# Patient Record
Sex: Male | Born: 1960 | Race: White | Hispanic: No | Marital: Married | Smoking: Never smoker
Health system: Southern US, Community
[De-identification: ages and names within clinical notes are randomized; demographics above are authoritative.]

## PROBLEM LIST (undated history)

## (undated) DIAGNOSIS — K2990 Gastroduodenitis, unspecified, without bleeding: Secondary | ICD-10-CM

## (undated) DIAGNOSIS — Z87442 Personal history of urinary calculi: Secondary | ICD-10-CM

## (undated) HISTORY — DX: Gastroduodenitis, unspecified, without bleeding: K29.90

## (undated) HISTORY — DX: Personal history of urinary calculi: Z87.442

---

## 2001-07-02 ENCOUNTER — Ambulatory Visit (HOSPITAL_COMMUNITY): Admission: RE | Admit: 2001-07-02 | Discharge: 2001-07-02 | Payer: Self-pay | Admitting: Pulmonary Disease

## 2003-07-02 ENCOUNTER — Ambulatory Visit (HOSPITAL_COMMUNITY): Admission: RE | Admit: 2003-07-02 | Discharge: 2003-07-02 | Payer: Self-pay | Admitting: Internal Medicine

## 2003-07-22 ENCOUNTER — Ambulatory Visit (HOSPITAL_COMMUNITY): Admission: RE | Admit: 2003-07-22 | Discharge: 2003-07-22 | Payer: Self-pay | Admitting: Internal Medicine

## 2006-03-07 DIAGNOSIS — Z87442 Personal history of urinary calculi: Secondary | ICD-10-CM

## 2006-03-07 HISTORY — DX: Personal history of urinary calculi: Z87.442

## 2006-03-07 HISTORY — PX: KIDNEY STONE SURGERY: SHX686

## 2006-10-31 ENCOUNTER — Ambulatory Visit (HOSPITAL_COMMUNITY): Admission: RE | Admit: 2006-10-31 | Discharge: 2006-10-31 | Payer: Self-pay | Admitting: Urology

## 2006-10-31 ENCOUNTER — Other Ambulatory Visit: Payer: Self-pay | Admitting: Urology

## 2009-02-09 ENCOUNTER — Ambulatory Visit (HOSPITAL_COMMUNITY): Admission: RE | Admit: 2009-02-09 | Discharge: 2009-02-09 | Payer: Self-pay | Admitting: Pulmonary Disease

## 2010-03-28 ENCOUNTER — Encounter: Payer: Self-pay | Admitting: Unknown Physician Specialty

## 2010-03-28 ENCOUNTER — Encounter (INDEPENDENT_AMBULATORY_CARE_PROVIDER_SITE_OTHER): Payer: Self-pay | Admitting: Internal Medicine

## 2010-07-20 NOTE — Op Note (Signed)
NAMEMAURICO, PERRELL               ACCOUNT NO.:  [id number]   MEDICAL RECORD NO.:  [id number]          PATIENT TYPE:  AMB   LOCATION:  DAY                          FACILITY:  [facility]   PHYSICIAN:  Boston Service, M.D.DATE OF BIRTH:  01/31/1961   DATE OF PROCEDURE:  10/31/2006  DATE OF DISCHARGE:                               OPERATIVE REPORT   PREOPERATIVE DIAGNOSIS:  6 mm left ureterovesical junction stone that  has probably been present for about five to six weeks.   POSTOPERATIVE DIAGNOSIS:  6 mm left ureterovesical junction stone that  has probably been present for about five to six weeks.   PROCEDURE:  Cystoscopy, retrograde ureteroscopy, holmium laser  fragmentation of stone the 365 fiber.   SURGEON:  Boston Service, M.D.   ASSISTANT:  None.   ANESTHESIA:  General.   FINDINGS:  6 mm left ureterovesical junction stone.   SPECIMENS:  Multiple small fragments.   ESTIMATED BLOOD LOSS:  Minimal.   COMPLICATIONS:  None obvious.   DESCRIPTION OF PROCEDURE:  The patient was prepped and draped in the  dorsal lithotomy position after institution of adequate level of general  anesthesia.  A well lubricated 21-French panendoscope was gently  inserted at the urethral meatus, normal urethra and sphincter,  nonobstructive prostate.  Clear reflux right orifice, minimal reflux  left orifice.   Blocking catheter was selected, positioned at the right and left  ureteral orifice.  Normal course and caliber of the ureter, pelvis and  calyces on the right side, 6 mm filling defects seen on the left side.  Guidewire was negotiated beyond the filling defect.  Short 6-French  ureteroscope was inserted alongside the guidewire.  Stone was localized  and fragmentation with the holmium laser fiber 365 was commenced at a  joule setting of 0.5.  Over period about 20 to 25 minutes, the stone was  adequately fragmented.  All fragments appeared to be 1 mm or less and  many of the  fragments  had passed into the bladder.  Ureteroscope was advanced.  There were no stony fragments identified within the proximal ureter.  Ureteroscope was withdrawn.  No double-J stent was left indwelling.  Bladder was drained.  Cystoscope was removed.  The patient was returned  to recovery in satisfactory condition.           ______________________________  Boston Service, M.D.     RH/MEDQ  D:  11/16/2006  T:  11/17/2006  Job:  [id number]   cc:   Ramon Dredge L. Juanetta Gosling, M.D.  Fax: [phone]

## 2010-07-23 NOTE — Consult Note (Signed)
Phillip, Benson                           ACCOUNT NO.:  [id number]   MEDICAL RECORD NO.:  [id number]                  PATIENT TYPE:   LOCATION:                                       FACILITY:  [facility]   PHYSICIAN:  Lionel December, M.D.                 DATE OF BIRTH:  11/28/1950   DATE OF CONSULTATION:  06/25/2003  DATE OF DISCHARGE:                                   CONSULTATION   GASTROENTEROLOGY CONSULTATION:   REQUESTING PHYSICIAN:  Dr. Juanetta Gosling   REASON FOR CONSULTATION:  Acute GERD symptoms.   HISTORY OF PRESENT ILLNESS:  Phillip Benson is a 50 year old Caucasian male who  presents to our office with a 61-month history of severe postprandial  epigastric pain and pressure which he describes as spasms.  He also notes  that it is associated with shortness of breath and relieved with belching.  He has been on Aciphex 20 mg daily for about 5 weeks now, this has resolved  his symptoms about 50%.  He also is complaining of some retrosternal chest  pressure however, he denies any acute pain.  He states his symptoms are  worse with certain foods including orange juice and chocolate.  He does  report occasional light-headedness with these episodes as well however, he  denies any diaphoresis.  He denies any nausea, vomiting, dysphagia, or  odynophagia.  He denies any lower abdominal pain.  He reports his bowel  movements have occasionally been hard since he has started the Aciphex  however, he denies any constipation, pretty much having bowel movements on a  daily basis.  He does have history of hemorrhoids with some small volume  bright red bleeding noted on toilet paper.  He does use over-the-counter  modalities and this has resolved over the last 2 weeks.  He states he has  been doing a lot of remodeling around his home, frequent heavy lifting as  well as crouching, and he notes increase in stress at home as well.  He  reports occasionally Goody's Powder two to three times a month;  denies any  other aspirin or NSAID use.  He is currently using Tums along with Aciphex  three or four times a day however, not every day.   PAST MEDICAL HISTORY:  1. Hemorrhoids.  2. Genital herpes diagnosed approximately 4 years ago being followed by Dr.     Juanetta Gosling.   PAST SURGICAL HISTORY:  Denies any.   CURRENT MEDICATIONS:  1. Aciphex 20 mg daily.  2. Tylenol p.r.n.   ALLERGIES:  No known drug allergies.   FAMILY HISTORY:  No known family history of colorectal carcinoma or lower  chronic GI problems.  Mother and father both in their 16s.  Mother does have  history of renal failure, hypertension, and hyperlipidemia.  He has a  maternal grandfather with history of diabetes mellitus and renal failure.  One healthy sister.  SOCIAL HISTORY:  Mr. Delaguila is married for 20 years.  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  He  denies any tobacco, alcohol, or drug use.   REVIEW OF SYSTEMS:  CONSTITUTIONAL:  Weight is steady.  Appetite fluctuates.  He has been eating more small meals.  He is complaining of some fatigue  although he attributes this to the increased amount of stress at home and  work.  NEUROLOGIC:  He is complaining of occasional severe frontal and  sinus headaches, he takes an occasional Goody's Powder for this.  GI:  See  HPI.   PHYSICAL EXAMINATION:  VITAL SIGNS:  Weight 179.5 pounds, height 71-1/2  inches.  Temperature 97, blood pressure 100/60, pulse 78.  GENERAL:  Phillip Benson is well developed, well nourished in no apparent  distress.  He appears his stated age.  HEENT:  Sclerae are clear, nonicteric.  Conjunctivae pink.  Oropharynx pink  and moist without any lesions.  NECK:  Supple without mass or thyromegaly.  CHEST:  Heart regular rate and rhythm with normal S1, S2, without murmurs,  clicks, rubs, or gallops.  LUNGS:  Clear to auscultation bilaterally.  ABDOMEN:  Slightly positive bowel sounds x4,  soft, nontender, nondistended.  No palpable mass or hepatosplenomegaly.  Negative Murphy's sign.  No rebound  tenderness or guarding.  EXTREMITIES:  Pulses bilaterally.  No edema.  SKIN:  Pink, warm and dry without any rash or jaundice.   ASSESSMENT:  Phillip Benson is a 50 year old Caucasian male with acute episode  of epigastric pain which he describes as spasms, symptoms have been relieved  50% with daily proton pump inhibitor therapy, there is a good possibility  his symptoms may be related to gastroesophageal reflux disease.  Because his  symptoms are not completely resolved with proton pump inhibitor, would  recommend further evaluation with EGD by Dr. Karilyn Cota to evaluate potential  complications of gastroesophageal reflux disease including Barrett's  esophagus.   RECOMMENDATIONS:  1. Have provided GERD literature.  2. Remain on the Aciphex 20 mg daily.  3. Recommend stool softener p.r.n.  4. I have given him prescription for Aciphex 20 mg to be taken 30 minutes     before breakfast in the morning (#30) with three refills, also I have     given 2-week samples.  5. We will scheduled EGD with Dr. Karilyn Cota.  I have discussed this procedure     including risks and benefits which     include but are not limited to bleeding, infection, perforation, and drug     reaction.  He agrees with this plan.  Consent will be obtained.   We would like to thank Dr. Juanetta Gosling for allowing [place] to participate in the  care of [name].     ________________________________________  ___________________________________________  Nicholas Lose, N.P.                  Lionel December, M.D.   KC/MEDQ  D:  06/25/2003  T:  06/26/2003  Job:  [id number]   cc:   Ramon Dredge L. Juanetta Gosling, M.D.  [address]  [place]  [place] [address]  Fax: [phone]

## 2010-07-23 NOTE — Op Note (Signed)
Phillip Benson, Phillip Benson                         ACCOUNT NO.:  [id number]   MEDICAL RECORD NO.:  [id number]                   PATIENT TYPE:  AMB   LOCATION:  DAY                                  FACILITY:  [facility]   PHYSICIAN:  Lionel December, M.D.                 DATE OF BIRTH:  05-Jul-1960   DATE OF PROCEDURE:  07/02/2003  DATE OF DISCHARGE:                                 OPERATIVE REPORT   PROCEDURE:  Esophagogastroduodenoscopy.   INDICATIONS FOR PROCEDURE:  Symeon is a 50 year old Caucasian male with a two  to three-month history of severe transient epigastric and chest pain which  almost always occurs postprandially and is described as a spasm.  He has  been treated with a PPI and he feels maybe 50% better.  He has also changed  his eating habits completely.  He denies dysphagia, hoarseness, chronic  cough, or melena.  He is undergoing diagnostic EGD.   The procedure risks were reviewed with the patient, and informed consent for  the procedure was obtained.   PREOPERATIVE MEDICATIONS:  Cetacaine spray for pharyngeal topical  anesthesia, Demerol 50 mg IV, Versed 5 mg IV in divided dose.   FINDINGS:  The procedure was performed in the endoscopy suite.  The  patient's vital signs and O2 saturations were monitored during the procedure  and remained stable.  The patient was placed in the left lateral recumbent  position, and the Olympus videoscope was passed via the oropharynx without  any difficulty into the esophagus.   Esophagus:  The mucosa of the esophagus was normal throughout.  The  squamocolumnar junction was carefully examined and was unremarkable.   Stomach:  It was empty and distended very well with insufflation.  The folds  of the proximal stomach were normal.  Examination of  the mucosa at the  gastric body, antrum, pyloric channel, as well as angularis, fundus, and  cardia was normal.   Duodenum:  Examination of the bulb and postbulbar duodenum was also normal.  The  endoscope was withdrawn.  The patient tolerated the procedure well.   FINAL DIAGNOSIS:  Normal esophagogastroduodenoscopy.   RECOMMENDATIONS:  1. He will continue antireflux measures and Aciphex as before.  2. Will try him on Levbid one-half to one tablet before breakfast.  If he     does not respond to this approach, will consider upper abdominal     ultrasound followed by esophageal manometry and pH.      ___________________________________________                                            Lionel December, M.D.   NR/MEDQ  D:  07/02/2003  T:  07/02/2003  Job:  [id number]   cc:   Ramon Dredge L. Juanetta Gosling, M.D.  [address]  [place]  [place] [address]  Fax: [phone]

## 2010-12-17 LAB — POCT HEMOGLOBIN-HEMACUE
Hemoglobin: 16.4
Operator id: 268271

## 2015-02-11 ENCOUNTER — Other Ambulatory Visit (HOSPITAL_COMMUNITY): Payer: Self-pay | Admitting: Pulmonary Disease

## 2015-02-11 ENCOUNTER — Ambulatory Visit (HOSPITAL_COMMUNITY)
Admission: RE | Admit: 2015-02-11 | Discharge: 2015-02-11 | Disposition: A | Discharge status: Home / Self Care | Payer: Federal, State, Local not specified - PPO | Source: Ambulatory Visit | Attending: Pulmonary Disease | Admitting: Pulmonary Disease

## 2015-02-11 DIAGNOSIS — R51 Headache: Secondary | ICD-10-CM | POA: Diagnosis not present

## 2015-02-11 DIAGNOSIS — J188 Other pneumonia, unspecified organism: Secondary | ICD-10-CM

## 2015-02-11 DIAGNOSIS — R05 Cough: Secondary | ICD-10-CM | POA: Insufficient documentation

## 2015-04-27 ENCOUNTER — Encounter: Payer: Self-pay | Admitting: Family Medicine

## 2015-04-27 DIAGNOSIS — Z87442 Personal history of urinary calculi: Secondary | ICD-10-CM | POA: Insufficient documentation

## 2015-04-30 ENCOUNTER — Encounter (INDEPENDENT_AMBULATORY_CARE_PROVIDER_SITE_OTHER): Payer: Self-pay

## 2015-04-30 ENCOUNTER — Ambulatory Visit (INDEPENDENT_AMBULATORY_CARE_PROVIDER_SITE_OTHER): Payer: Federal, State, Local not specified - PPO | Admitting: Family Medicine

## 2015-04-30 ENCOUNTER — Encounter: Payer: Self-pay | Admitting: Family Medicine

## 2015-04-30 VITALS — BP 118/78 | HR 80 | Temp 97.7°F | Resp 14 | Ht 71.0 in | Wt 169.0 lb

## 2015-04-30 DIAGNOSIS — Z23 Encounter for immunization: Secondary | ICD-10-CM

## 2015-04-30 DIAGNOSIS — Z0000 Encounter for general adult medical examination without abnormal findings: Secondary | ICD-10-CM | POA: Diagnosis not present

## 2015-04-30 LAB — CBC WITH DIFFERENTIAL/PLATELET
Basophils Absolute: 0 10*3/uL (ref 0.0–0.1)
Basophils Relative: 0 % (ref 0–1)
Eosinophils Absolute: 0.1 10*3/uL (ref 0.0–0.7)
Eosinophils Relative: 2 % (ref 0–5)
HCT: 48.6 % (ref 39.0–52.0)
HEMOGLOBIN: 16.5 g/dL (ref 13.0–17.0)
LYMPHS ABS: 1.5 10*3/uL (ref 0.7–4.0)
Lymphocytes Relative: 29 % (ref 12–46)
MCH: 30.6 pg (ref 26.0–34.0)
MCHC: 34 g/dL (ref 30.0–36.0)
MCV: 90.2 fL (ref 78.0–100.0)
MONOS PCT: 5 % (ref 3–12)
MPV: 9.8 fL (ref 8.6–12.4)
Monocytes Absolute: 0.3 10*3/uL (ref 0.1–1.0)
NEUTROS ABS: 3.3 10*3/uL (ref 1.7–7.7)
NEUTROS PCT: 64 % (ref 43–77)
Platelets: 221 10*3/uL (ref 150–400)
RBC: 5.39 MIL/uL (ref 4.22–5.81)
RDW: 13.9 % (ref 11.5–15.5)
WBC: 5.2 10*3/uL (ref 4.0–10.5)

## 2015-04-30 LAB — COMPLETE METABOLIC PANEL WITH GFR
ALBUMIN: 4.3 g/dL (ref 3.6–5.1)
ALK PHOS: 47 U/L (ref 40–115)
ALT: 17 U/L (ref 9–46)
AST: 21 U/L (ref 10–35)
BILIRUBIN TOTAL: 0.8 mg/dL (ref 0.2–1.2)
BUN: 13 mg/dL (ref 7–25)
CALCIUM: 9.2 mg/dL (ref 8.6–10.3)
CO2: 29 mmol/L (ref 20–31)
Chloride: 103 mmol/L (ref 98–110)
Creat: 0.94 mg/dL (ref 0.70–1.33)
Glucose, Bld: 83 mg/dL (ref 70–99)
Potassium: 4.3 mmol/L (ref 3.5–5.3)
Sodium: 140 mmol/L (ref 135–146)
TOTAL PROTEIN: 6.7 g/dL (ref 6.1–8.1)

## 2015-04-30 LAB — LIPID PANEL
Cholesterol: 173 mg/dL (ref 125–200)
HDL: 54 mg/dL (ref >=40)
LDL CALC: 95 mg/dL (ref <130)
TRIGLYCERIDES: 119 mg/dL (ref <150)
Total CHOL/HDL Ratio: 3.2 Ratio (ref <=5.0)
VLDL: 24 mg/dL (ref <30)

## 2015-04-30 NOTE — Addendum Note (Signed)
Addended by: Legrand Rams B on: 04/30/2015 12:24 PM   Modules accepted: Orders

## 2015-04-30 NOTE — Progress Notes (Signed)
Subjective:    Patient ID: Phillip Benson, male    DOB: 12/20/1960, 55 y.o.   MRN: [id number]  HPI  patient is a very pleasant 55 year old white male here today to establish care. He is overdue for colonoscopy. He is also due for his tetanus shot. Otherwise he is doing well with no concerns. He works at the [employer] but is scheduled to retire in less than a year. His only complaint is occasional PVCs. His symptoms sound consistent with PVCs as he will experience an occasional brief fluttering in the chest that last less than one or 2 seconds. He denies any syncope or presyncope. He denies any tachyarrhythmia. Past Medical History  Diagnosis Date  . Personal history of kidney stones    Past Surgical History  Procedure Laterality Date  . Kidney stone surgery  2007   Current Outpatient Prescriptions on File Prior to Visit  Medication Sig Dispense Refill  . Glucosamine-Chondroit-Vit C-Mn (GLUCOSAMINE 1500 COMPLEX PO) Take 1 capsule by mouth daily.    . Multiple Vitamin (MULTIVITAMIN) tablet Take 1 tablet by mouth daily.    . Omega-3 Fatty Acids (FISH OIL) 1000 MG CAPS Take 1 capsule by mouth daily.     No current facility-administered medications on file prior to visit.   No Known Allergies Social History   Social History  . Marital Status: Married    Spouse Name: N/A  . Number of Children: N/A  . Years of Education: N/A   Occupational History  . Not on file.   Social History Main Topics  . Smoking status: Never Smoker   . Smokeless tobacco: Never Used  . Alcohol Use: No  . Drug Use: No  . Sexual Activity: Not on file   Other Topics Concern  . Not on file   Social History Narrative   Family History  Problem Relation Age of Onset  . Kidney disease Mother   . Heart disease Maternal Grandmother   . Cancer Paternal Grandfather   . Kidney disease Paternal Grandfather       Review of Systems  All other systems reviewed and are negative.      Objective:    Physical Exam  Constitutional: He is oriented to person, place, and time. He appears well-developed and well-nourished. No distress.  HENT:  Head: Normocephalic and atraumatic.  Right Ear: External ear normal.  Left Ear: External ear normal.  Nose: Nose normal.  Mouth/Throat: Oropharynx is clear and moist. No oropharyngeal exudate.  Eyes: Conjunctivae and EOM are normal. Pupils are equal, round, and reactive to light. Right eye exhibits no discharge. Left eye exhibits no discharge. No scleral icterus.  Neck: Normal range of motion. Neck supple. No JVD present. No tracheal deviation present. No thyromegaly present.  Cardiovascular: Normal rate, regular rhythm and normal heart sounds.  Exam reveals no gallop and no friction rub.   No murmur heard. Pulmonary/Chest: Effort normal and breath sounds normal. No stridor. No respiratory distress. He has no wheezes. He has no rales. He exhibits no tenderness.  Abdominal: Soft. Bowel sounds are normal. He exhibits no distension and no mass. There is no tenderness. There is no rebound and no guarding.  Genitourinary: Rectum normal and prostate normal.  Musculoskeletal: Normal range of motion. He exhibits no edema or tenderness.  Lymphadenopathy:    He has no cervical adenopathy.  Neurological: He is alert and oriented to person, place, and time. He has normal reflexes. He displays normal reflexes. No cranial nerve deficit. He  exhibits normal muscle tone. Coordination normal.  Skin: Skin is warm. No rash noted. He is not diaphoretic. No erythema. No pallor.  Psychiatric: He has a normal mood and affect. His behavior is normal. Judgment and thought content normal.  Vitals reviewed.         Assessment & Plan:  Routine general medical examination at a health care facility - Plan: CBC with Differential/Platelet, COMPLETE METABOLIC PANEL WITH GFR, Lipid panel, PSA   Patient received his tetanus shot today. I will schedule him for a colonoscopy. I will  obtain a CBC, CMP, fasting lipid panel, and a PSA. His prostate exam is normal. He declined HIV and hepatitis C screening. If palpitations worsen, he would like to proceed with an event monitor/Holter monitor but at the present time he is comfortable just monitoring this clinically

## 2015-05-01 ENCOUNTER — Encounter: Payer: Self-pay | Admitting: Family Medicine

## 2015-05-01 LAB — PSA: PSA: 1.49 ng/mL (ref <=4.00)

## 2015-05-27 DIAGNOSIS — R52 Pain, unspecified: Secondary | ICD-10-CM | POA: Insufficient documentation

## 2015-05-27 DIAGNOSIS — M47812 Spondylosis without myelopathy or radiculopathy, cervical region: Secondary | ICD-10-CM | POA: Insufficient documentation

## 2015-06-01 ENCOUNTER — Other Ambulatory Visit: Payer: Self-pay | Admitting: Orthopedic Surgery

## 2015-06-01 DIAGNOSIS — M25532 Pain in left wrist: Secondary | ICD-10-CM

## 2015-06-10 ENCOUNTER — Other Ambulatory Visit: Payer: Federal, State, Local not specified - PPO

## 2015-06-10 DIAGNOSIS — M545 Low back pain: Secondary | ICD-10-CM | POA: Diagnosis not present

## 2015-06-10 DIAGNOSIS — M9903 Segmental and somatic dysfunction of lumbar region: Secondary | ICD-10-CM | POA: Diagnosis not present

## 2015-06-10 DIAGNOSIS — M9902 Segmental and somatic dysfunction of thoracic region: Secondary | ICD-10-CM | POA: Diagnosis not present

## 2015-06-10 DIAGNOSIS — M546 Pain in thoracic spine: Secondary | ICD-10-CM | POA: Diagnosis not present

## 2015-06-26 DIAGNOSIS — K2980 Duodenitis without bleeding: Secondary | ICD-10-CM | POA: Diagnosis not present

## 2015-06-26 DIAGNOSIS — D125 Benign neoplasm of sigmoid colon: Secondary | ICD-10-CM | POA: Diagnosis not present

## 2015-06-26 DIAGNOSIS — Z1211 Encounter for screening for malignant neoplasm of colon: Secondary | ICD-10-CM | POA: Diagnosis not present

## 2015-06-26 DIAGNOSIS — K2950 Unspecified chronic gastritis without bleeding: Secondary | ICD-10-CM | POA: Diagnosis not present

## 2015-06-26 DIAGNOSIS — R1310 Dysphagia, unspecified: Secondary | ICD-10-CM | POA: Diagnosis not present

## 2015-06-26 DIAGNOSIS — K621 Rectal polyp: Secondary | ICD-10-CM | POA: Diagnosis not present

## 2015-06-26 DIAGNOSIS — K5730 Diverticulosis of large intestine without perforation or abscess without bleeding: Secondary | ICD-10-CM | POA: Diagnosis not present

## 2015-06-26 DIAGNOSIS — D123 Benign neoplasm of transverse colon: Secondary | ICD-10-CM | POA: Diagnosis not present

## 2015-07-06 HISTORY — PX: OTHER SURGICAL HISTORY: SHX169

## 2015-07-06 HISTORY — PX: ESOPHAGOGASTRODUODENOSCOPY ENDOSCOPY: SHX5814

## 2015-07-08 DIAGNOSIS — K080 Exfoliation of teeth due to systemic causes: Secondary | ICD-10-CM | POA: Diagnosis not present

## 2015-07-09 ENCOUNTER — Ambulatory Visit: Payer: Self-pay | Admitting: Orthopedic Surgery

## 2015-07-09 NOTE — Progress Notes (Signed)
Preoperative surgical orders have been place into the Epic hospital system for Phillip Benson on 07/09/2015, 10:29 AM  by Patrica DuelPERKINS, Ziare Orrick for surgery on 07-22-15.  Preop Knee Scope orders including IV Tylenol and IV Decadron as long as there are no contraindications to the above medications. Phillip Peacerew Decklyn Hyder, PA-C

## 2015-07-13 ENCOUNTER — Encounter: Payer: Self-pay | Admitting: Family Medicine

## 2015-07-16 NOTE — Patient Instructions (Addendum)
Phillip Benson  07/16/2015   Your procedure is scheduled on: 07-22-15   Report to Phillip Benson Main  Entrance take Phillip Benson  elevators to 3rd floor to  Short Stay Benson at 1330 AM.  Call this number if you have problems the morning of surgery [phone]   Remember: ONLY 1 PERSON M,AY GO WITH YOU TO SHORT STAY TO GET  READY MORNING OF YOUR SURGERY.  Do not eat food After Midnight, may have clear liquids from midnight until 930 am, nothing by mouth after 930 am day of surgery.     Take these medicines the morning of surgery with A SIP OF WATER:  none                                You may not have any metal on your body including hair pins and              piercings  Do not wear jewelry, make-up, lotions, powders or perfumes, deodorant             Do not wear nail polish.  Do not shave  48 hours prior to surgery.              Men may shave face and neck.   Do not bring valuables to the Benson. Phillip Benson IS NOT             RESPONSIBLE   FOR VALUABLES.  Contacts, dentures or bridgework may not be worn into surgery.  Leave suitcase in the car. After surgery it may be brought to your room.     Patients discharged the day of surgery will not be allowed to drive home.  Name and phone number of your driver:  Special Instructions: N/A              Please read over the following fact sheets you were given: _____________________________________________________________________             Phillip Benson - Preparing for Surgery Before surgery, you can play an important role.  Because skin is not sterile, your skin needs to be as free of germs as possible.  You can reduce the number of germs on your skin by washing with CHG (chlorahexidine gluconate) soap before surgery.  CHG is an antiseptic cleaner which kills germs and bonds with the skin to continue killing germs even after washing. Please DO NOT use if you have an allergy to CHG or antibacterial soaps.  If your skin  becomes reddened/irritated stop using the CHG and inform your nurse when you arrive at Short Stay. Do not shave (including legs and underarms) for at least 48 hours prior to the first CHG shower.  You may shave your face/neck. Please follow these instructions carefully:  1.  Shower with CHG Soap the night before surgery and the  morning of Surgery.  2.  If you choose to wash your hair, wash your hair first as usual with your  normal  shampoo.  3.  After you shampoo, rinse your hair and body thoroughly to remove the  shampoo.                           4.  Use CHG as you would any other liquid soap.  You can apply chg directly  to the skin and wash                       Gently with a scrungie or clean washcloth.  5.  Apply the CHG Soap to your body ONLY FROM THE NECK DOWN.   Do not use on face/ open                           Wound or open sores. Avoid contact with eyes, ears mouth and genitals (private parts).                       Wash face,  Genitals (private parts) with your normal soap.             6.  Wash thoroughly, paying special attention to the area where your surgery  will be performed.  7.  Thoroughly rinse your body with warm water from the neck down.  8.  DO NOT shower/wash with your normal soap after using and rinsing off  the CHG Soap.                9.  Pat yourself dry with a clean towel.            10.  Wear clean pajamas.            11.  Place clean sheets on your bed the night of your first shower and do not  sleep with pets. Day of Surgery : Do not apply any lotions/deodorants the morning of surgery.  Please wear clean clothes to the Benson/surgery Benson.  FAILURE TO FOLLOW THESE INSTRUCTIONS MAY RESULT IN THE CANCELLATION OF YOUR SURGERY PATIENT SIGNATURE_________________________________  NURSE SIGNATURE__________________________________  ________________________________________________________________________   Phillip Benson  An incentive spirometer is a  tool that can help keep your lungs clear and active. This tool measures how well you are filling your lungs with each breath. Taking long deep breaths may help reverse or decrease the chance of developing breathing (pulmonary) problems (especially infection) following:  A long period of time when you are unable to move or be active. BEFORE THE PROCEDURE   If the spirometer includes an indicator to show your best effort, your nurse or respiratory therapist will set it to a desired goal.  If possible, sit up straight or lean slightly forward. Try not to slouch.  Hold the incentive spirometer in an upright position. INSTRUCTIONS FOR USE  1. Sit on the edge of your bed if possible, or sit up as far as you can in bed or on a chair. 2. Hold the incentive spirometer in an upright position. 3. Breathe out normally. 4. Place the mouthpiece in your mouth and seal your lips tightly around it. 5. Breathe in slowly and as deeply as possible, raising the piston or the ball toward the top of the column. 6. Hold your breath for 3-5 seconds or for as long as possible. Allow the piston or ball to fall to the bottom of the column. 7. Remove the mouthpiece from your mouth and breathe out normally. 8. Rest for a few seconds and repeat Steps 1 through 7 at least 10 times every 1-2 hours when you are awake. Take your time and take a few normal breaths between deep breaths. 9. The spirometer may include an indicator to show your best effort. Use the indicator as a goal to work toward during each repetition. 10. After each  set of 10 deep breaths, practice coughing to be sure your lungs are clear. If you have an incision (the cut made at the time of surgery), support your incision when coughing by placing a pillow or rolled up towels firmly against it. Once you are able to get out of bed, walk around indoors and cough well. You may stop using the incentive spirometer when instructed by your caregiver.  RISKS AND  COMPLICATIONS  Take your time so you do not get dizzy or light-headed.  If you are in pain, you may need to take or ask for pain medication before doing incentive spirometry. It is harder to take a deep breath if you are having pain. AFTER USE  Rest and breathe slowly and easily.  It can be helpful to keep track of a log of your progress. Your caregiver can provide you with a simple table to help with this. If you are using the spirometer at home, follow these instructions: SEEK MEDICAL Benson IF:   You are having difficultly using the spirometer.  You have trouble using the spirometer as often as instructed.  Your pain medication is not giving enough relief while using the spirometer.  You develop fever of 100.5 F (38.1 C) or higher. SEEK IMMEDIATE MEDICAL Benson IF:   You cough up bloody sputum that had not been present before.  You develop fever of 102 F (38.9 C) or greater.  You develop worsening pain at or near the incision site. MAKE SURE YOU:   Understand these instructions.  Will watch your condition.  Will get help right away if you are not doing well or get worse. Document Released: 07/04/2006 Document Revised: 05/16/2011 Document Reviewed: 09/04/2006 Exit[employer] Patient Information 2014 [employer], [place].   ________________________________________________________________________    CLEAR LIQUID DIET   Foods Allowed                                                                     Foods Excluded  Coffee and tea, regular and decaf                             liquids that you cannot  Plain Jell-O in any flavor                                             see through such as: Fruit ices (not with fruit pulp)                                     milk, soups, orange juice  Iced Popsicles                                    All solid food Carbonated beverages, regular and diet  Cranberry, grape and apple juices Sports drinks like  Gatorade Lightly seasoned clear broth or consume(fat free) Sugar, honey syrup  Sample Menu Breakfast                                Lunch                                     Supper Cranberry juice                    Beef broth                            Chicken broth Jell-O                                     Grape juice                           Apple juice Coffee or tea                        Jell-O                                      Popsicle                                                Coffee or tea                        Coffee or tea  _____________________________________________________________________

## 2015-07-17 ENCOUNTER — Encounter (HOSPITAL_COMMUNITY): Payer: Self-pay

## 2015-07-17 ENCOUNTER — Encounter (HOSPITAL_COMMUNITY)
Admission: RE | Admit: 2015-07-17 | Discharge: 2015-07-17 | Disposition: A | Discharge status: Home / Self Care | Payer: Federal, State, Local not specified - PPO | Source: Ambulatory Visit | Attending: Orthopedic Surgery | Admitting: Orthopedic Surgery

## 2015-07-17 DIAGNOSIS — Z01812 Encounter for preprocedural laboratory examination: Secondary | ICD-10-CM | POA: Diagnosis not present

## 2015-07-17 LAB — HEMOGLOBIN: HEMOGLOBIN: 16.7 g/dL (ref 13.0–17.0)

## 2015-07-17 LAB — SURGICAL PCR SCREEN
MRSA, PCR: NEGATIVE
Staphylococcus aureus: NEGATIVE

## 2015-07-21 DIAGNOSIS — S83249A Other tear of medial meniscus, current injury, unspecified knee, initial encounter: Secondary | ICD-10-CM | POA: Diagnosis present

## 2015-07-21 NOTE — H&P (Signed)
  CC- Phillip Benson is a 55 y.o. male who presents with right knee pain.  HPI- . Knee Pain: Patient presents with knee pain involving the  right knee. Onset of the symptoms was several years ago. Inciting event: none known. Current symptoms include intermittent giving out and pain located medially. Pain is aggravated by kneeling, lateral movements, pivoting, rising after sitting and squatting.  Patient has had no prior knee problems. Evaluation to date: MRI with medial meniscal tear. Treatment to date: avoidance of offending activity and rest.  Past Medical History  Diagnosis Date  . Personal history of kidney stones 2008    x 1  . Gastritis and duodenitis     egd-2017    Past Surgical History  Procedure Laterality Date  . Kidney stone surgery  2008  . Esophagogastroduodenoscopy endoscopy  may 2017  . Colonscopy  may 2017    benign polyps removed    Prior to Admission medications   Medication Sig Start Date End Date Taking? Authorizing Provider  Glucosamine-Chondroit-Vit C-Mn (GLUCOSAMINE 1500 COMPLEX PO) Take 1 capsule by mouth daily.   Yes Historical Provider, MD  Multiple Vitamin (MULTIVITAMIN) tablet Take 1 tablet by mouth daily.   Yes Historical Provider, MD  Omega-3 Fatty Acids (FISH OIL) 1000 MG CAPS Take 1,000 mg by mouth daily.    Yes Historical Provider, MD  acetaminophen (TYLENOL) 325 MG tablet Take 650 mg by mouth every 6 (six) hours as needed.    Historical Provider, MD   Right KNEE EXAM antalgic gait, soft tissue tenderness over medial joint line, no effusion, ligaments intact  Physical Examination: General appearance - alert, well appearing, and in no distress Mental status - alert, oriented to person, place, and time Chest - clear to auscultation, no wheezes, rales or rhonchi, symmetric air entry Heart - normal rate, regular rhythm, normal S1, S2, no murmurs, rubs, clicks or gallops Abdomen - soft, nontender, nondistended, no masses or organomegaly Neurological -  alert, oriented, normal speech, no focal findings or movement disorder noted   Asessment/Plan--- Right knee medial meniscal tear- - Plan right knee arthroscopy with meniscal debridement. Procedure risks and potential comps discussed with patient who elects to proceed. Goals are decreased pain and increased function with a high likelihood of achieving both

## 2015-07-22 ENCOUNTER — Encounter (HOSPITAL_COMMUNITY): Payer: Self-pay | Admitting: Anesthesiology

## 2015-07-22 ENCOUNTER — Encounter (HOSPITAL_COMMUNITY)
Admission: RE | Disposition: A | Discharge status: Home / Self Care | Payer: Self-pay | Source: Ambulatory Visit | Attending: Orthopedic Surgery

## 2015-07-22 ENCOUNTER — Ambulatory Visit (HOSPITAL_COMMUNITY): Payer: Federal, State, Local not specified - PPO | Admitting: Anesthesiology

## 2015-07-22 ENCOUNTER — Ambulatory Visit (HOSPITAL_COMMUNITY)
Admission: RE | Admit: 2015-07-22 | Discharge: 2015-07-22 | Disposition: A | Discharge status: Home / Self Care | Payer: Federal, State, Local not specified - PPO | Source: Ambulatory Visit | Attending: Orthopedic Surgery | Admitting: Orthopedic Surgery

## 2015-07-22 DIAGNOSIS — Z79899 Other long term (current) drug therapy: Secondary | ICD-10-CM | POA: Diagnosis not present

## 2015-07-22 DIAGNOSIS — M25561 Pain in right knee: Secondary | ICD-10-CM | POA: Diagnosis present

## 2015-07-22 DIAGNOSIS — S83281A Other tear of lateral meniscus, current injury, right knee, initial encounter: Secondary | ICD-10-CM | POA: Diagnosis not present

## 2015-07-22 DIAGNOSIS — M23221 Derangement of posterior horn of medial meniscus due to old tear or injury, right knee: Secondary | ICD-10-CM | POA: Insufficient documentation

## 2015-07-22 DIAGNOSIS — S83249A Other tear of medial meniscus, current injury, unspecified knee, initial encounter: Secondary | ICD-10-CM | POA: Diagnosis present

## 2015-07-22 DIAGNOSIS — M23321 Other meniscus derangements, posterior horn of medial meniscus, right knee: Secondary | ICD-10-CM | POA: Diagnosis not present

## 2015-07-22 HISTORY — PX: KNEE ARTHROSCOPY WITH LATERAL MENISECTOMY: SHX6193

## 2015-07-22 SURGERY — ARTHROSCOPY, KNEE, WITH LATERAL MENISCECTOMY
Anesthesia: General | Site: Knee | Laterality: Right

## 2015-07-22 MED ORDER — KETOROLAC TROMETHAMINE 30 MG/ML IJ SOLN
INTRAMUSCULAR | Status: AC
  Filled 2015-07-22: qty 2

## 2015-07-22 MED ORDER — POVIDONE-IODINE 10 % EX SWAB: 2.0000 "application " | Freq: Once | CUTANEOUS | Status: DC

## 2015-07-22 MED ORDER — CEFAZOLIN SODIUM-DEXTROSE 2-4 GM/100ML-% IV SOLN
2.0000 g | INTRAVENOUS | Status: AC
  Administered 2015-07-22: 2 g via INTRAVENOUS

## 2015-07-22 MED ORDER — ACETAMINOPHEN 10 MG/ML IV SOLN
1000.0000 mg | Freq: Once | INTRAVENOUS | Status: AC
  Administered 2015-07-22: 1000 mg via INTRAVENOUS
  Filled 2015-07-22: qty 100

## 2015-07-22 MED ORDER — ONDANSETRON HCL 4 MG/2ML IJ SOLN
INTRAMUSCULAR | Status: AC
  Filled 2015-07-22: qty 4

## 2015-07-22 MED ORDER — LACTATED RINGERS IV SOLN
INTRAVENOUS | Status: DC | PRN
  Administered 2015-07-22 (×2): via INTRAVENOUS

## 2015-07-22 MED ORDER — LACTATED RINGERS IV SOLN: INTRAVENOUS | Status: DC | PRN

## 2015-07-22 MED ORDER — MIDAZOLAM HCL 5 MG/5ML IJ SOLN
INTRAMUSCULAR | Status: DC | PRN
  Administered 2015-07-22: 2 mg via INTRAVENOUS

## 2015-07-22 MED ORDER — ONDANSETRON HCL 4 MG/2ML IJ SOLN
INTRAMUSCULAR | Status: DC | PRN
  Administered 2015-07-22: 4 mg via INTRAVENOUS

## 2015-07-22 MED ORDER — CEFAZOLIN SODIUM-DEXTROSE 2-4 GM/100ML-% IV SOLN
INTRAVENOUS | Status: AC
  Filled 2015-07-22: qty 100

## 2015-07-22 MED ORDER — MIDAZOLAM HCL 2 MG/2ML IJ SOLN
INTRAMUSCULAR | Status: AC
  Filled 2015-07-22: qty 2

## 2015-07-22 MED ORDER — HYDROCODONE-ACETAMINOPHEN 5-325 MG PO TABS: 1.0000 | ORAL_TABLET | ORAL | Status: DC | PRN

## 2015-07-22 MED ORDER — LACTATED RINGERS IR SOLN
Status: DC | PRN
  Administered 2015-07-22: 9000 mL

## 2015-07-22 MED ORDER — DEXAMETHASONE SODIUM PHOSPHATE 10 MG/ML IJ SOLN
10.0000 mg | Freq: Once | INTRAMUSCULAR | Status: AC
  Administered 2015-07-22: 10 mg via INTRAVENOUS

## 2015-07-22 MED ORDER — GLYCOPYRROLATE 0.2 MG/ML IJ SOLN
INTRAMUSCULAR | Status: AC
  Filled 2015-07-22: qty 2

## 2015-07-22 MED ORDER — FENTANYL CITRATE (PF) 100 MCG/2ML IJ SOLN
INTRAMUSCULAR | Status: DC | PRN
  Administered 2015-07-22 (×2): 50 ug via INTRAVENOUS

## 2015-07-22 MED ORDER — PROPOFOL 10 MG/ML IV BOLUS
INTRAVENOUS | Status: AC
  Filled 2015-07-22: qty 20

## 2015-07-22 MED ORDER — LACTATED RINGERS IV SOLN: INTRAVENOUS | Status: DC

## 2015-07-22 MED ORDER — FENTANYL CITRATE (PF) 250 MCG/5ML IJ SOLN
INTRAMUSCULAR | Status: AC
  Filled 2015-07-22: qty 5

## 2015-07-22 MED ORDER — DEXAMETHASONE SODIUM PHOSPHATE 10 MG/ML IJ SOLN
INTRAMUSCULAR | Status: AC
  Filled 2015-07-22: qty 1

## 2015-07-22 MED ORDER — BUPIVACAINE-EPINEPHRINE (PF) 0.25% -1:200000 IJ SOLN
INTRAMUSCULAR | Status: AC
  Filled 2015-07-22: qty 30

## 2015-07-22 MED ORDER — METHOCARBAMOL 500 MG PO TABS: 500.0000 mg | ORAL_TABLET | Freq: Four times a day (QID) | ORAL | Status: DC

## 2015-07-22 MED ORDER — SODIUM CHLORIDE 0.9 % IV SOLN: INTRAVENOUS | Status: DC

## 2015-07-22 MED ORDER — LIDOCAINE HCL (CARDIAC) 20 MG/ML IV SOLN
INTRAVENOUS | Status: DC | PRN
  Administered 2015-07-22: 100 mg via INTRAVENOUS

## 2015-07-22 MED ORDER — PROPOFOL 10 MG/ML IV BOLUS
INTRAVENOUS | Status: DC | PRN
  Administered 2015-07-22: 200 mg via INTRAVENOUS

## 2015-07-22 MED ORDER — BUPIVACAINE-EPINEPHRINE 0.25% -1:200000 IJ SOLN
INTRAMUSCULAR | Status: DC | PRN
  Administered 2015-07-22: 20 mL

## 2015-07-22 MED ORDER — HYDROMORPHONE HCL 1 MG/ML IJ SOLN
0.2500 mg | INTRAMUSCULAR | Status: DC | PRN
  Administered 2015-07-22: 0.5 mg via INTRAVENOUS

## 2015-07-22 MED ORDER — ACETAMINOPHEN 10 MG/ML IV SOLN
INTRAVENOUS | Status: AC
  Filled 2015-07-22: qty 100

## 2015-07-22 MED ORDER — STERILE WATER FOR IRRIGATION IR SOLN
Status: DC | PRN
  Administered 2015-07-22: 500 mL

## 2015-07-22 MED ORDER — HYDROMORPHONE HCL 1 MG/ML IJ SOLN
INTRAMUSCULAR | Status: AC
  Filled 2015-07-22: qty 1

## 2015-07-22 SURGICAL SUPPLY — 30 items
BANDAGE ACE 6X5 VEL STRL LF (GAUZE/BANDAGES/DRESSINGS) ×3 IMPLANT
BLADE 4.2CUDA (BLADE) ×3 IMPLANT
BLADE CUDA SHAVER 3.5 (BLADE) ×3 IMPLANT
COVER SURGICAL LIGHT HANDLE (MISCELLANEOUS) ×3 IMPLANT
CUFF TOURN SGL QUICK 34 (TOURNIQUET CUFF) ×3
CUFF TRNQT CYL 34X4X40X1 (TOURNIQUET CUFF) ×1 IMPLANT
DRAPE U-SHAPE 47X51 STRL (DRAPES) ×3 IMPLANT
DRSG EMULSION OIL 3X3 NADH (GAUZE/BANDAGES/DRESSINGS) ×3 IMPLANT
DRSG PAD ABDOMINAL 8X10 ST (GAUZE/BANDAGES/DRESSINGS) ×3 IMPLANT
DURAPREP 26ML APPLICATOR (WOUND CARE) ×3 IMPLANT
GAUZE SPONGE 4X4 12PLY STRL (GAUZE/BANDAGES/DRESSINGS) ×3 IMPLANT
GLOVE BIO SURGEON STRL SZ8 (GLOVE) ×3 IMPLANT
GLOVE BIOGEL PI IND STRL 7.5 (GLOVE) ×2 IMPLANT
GLOVE BIOGEL PI IND STRL 8 (GLOVE) ×1 IMPLANT
GLOVE BIOGEL PI INDICATOR 7.5 (GLOVE) ×4
GLOVE BIOGEL PI INDICATOR 8 (GLOVE) ×2
GOWN STRL REUS W/TWL LRG LVL3 (GOWN DISPOSABLE) ×6 IMPLANT
KIT BASIN OR (CUSTOM PROCEDURE TRAY) ×3 IMPLANT
MANIFOLD NEPTUNE II (INSTRUMENTS) ×3 IMPLANT
MARKER SKIN DUAL TIP RULER LAB (MISCELLANEOUS) ×3 IMPLANT
PACK ARTHROSCOPY WL (CUSTOM PROCEDURE TRAY) ×3 IMPLANT
PACK ICE MAXI GEL EZY WRAP (MISCELLANEOUS) ×9 IMPLANT
PAD ABD 8X10 STRL (GAUZE/BANDAGES/DRESSINGS) ×3 IMPLANT
PADDING CAST COTTON 6X4 STRL (CAST SUPPLIES) ×3 IMPLANT
POSITIONER SURGICAL ARM (MISCELLANEOUS) ×3 IMPLANT
SUT ETHILON 4 0 PS 2 18 (SUTURE) ×3 IMPLANT
TOWEL OR 17X26 10 PK STRL BLUE (TOWEL DISPOSABLE) ×3 IMPLANT
TUBING ARTHRO INFLOW-ONLY STRL (TUBING) ×3 IMPLANT
WAND HAND CNTRL MULTIVAC 90 (MISCELLANEOUS) ×3 IMPLANT
WRAP KNEE MAXI GEL POST OP (GAUZE/BANDAGES/DRESSINGS) ×3 IMPLANT

## 2015-07-22 NOTE — Discharge Instructions (Signed)
° °Dr. Eldwin Volkov °Total Joint Specialist °[facility] °[address] °, [place] [address] °[phone] ° ° °Arthroscopic Procedure, Knee °An arthroscopic procedure can find what is wrong with your knee. °PROCEDURE °Arthroscopy is a surgical technique that allows your orthopedic surgeon to diagnose and treat your knee injury with accuracy. They will look into your knee through a small instrument. This is almost like a small (pencil sized) telescope. Because arthroscopy affects your knee less than open knee surgery, you can anticipate a more rapid recovery. Taking an active role by following your caregiver's instructions will help with rapid and complete recovery. Use crutches, rest, elevation, ice, and knee exercises as instructed. The length of recovery depends on various factors including type of injury, age, physical condition, medical conditions, and your rehabilitation. °Your knee is the joint between the large bones (femur and tibia) in your leg. Cartilage covers these bone ends which are smooth and slippery and allow your knee to bend and move smoothly. Two menisci, thick, semi-lunar shaped pads of cartilage which form a rim inside the joint, help absorb shock and stabilize your knee. Ligaments bind the bones together and support your knee joint. Muscles move the joint, help support your knee, and take stress off the joint itself. Because of this all programs and physical therapy to rehabilitate an injured or repaired knee require rebuilding and strengthening your muscles. °AFTER THE PROCEDURE °· After the procedure, you will be moved to a recovery area until most of the effects of the medication have worn off. Your caregiver will discuss the test results with you.  °· Only take over-the-counter or prescription medicines for pain, discomfort, or fever as directed by your caregiver.  °SEEK MEDICAL CARE IF:  °· You have increased bleeding from your wounds.  °· You see  redness, swelling, or have increasing pain in your wounds.  °· You have pus coming from your wound.  °· You have an oral temperature above 102° F (38.9° C).  °· You notice a bad smell coming from the wound or dressing.  °· You have severe pain with any motion of your knee.  °SEEK IMMEDIATE MEDICAL CARE IF:  °· You develop a rash.  °· You have difficulty breathing.  °· You have any allergic problems.  °FURTHER INSTRUCTIONS:  °· ICE to the affected knee every three hours for 30 minutes at a time and then as needed for pain and swelling.  Continue to use ice on the knee for pain and swelling from surgery. You may notice swelling that will progress down to the foot and ankle.  This is normal after surgery.  Elevate the leg when you are not up walking on it.   ° °DIET °You may resume your previous home diet once your are discharged from the hospital. ° °DRESSING / WOUND CARE / SHOWERING °You may start showering two days after being discharged home but do not submerge the incisions under water.  °Change dressing 48 hours after the procedure and then cover the small incisions with band aids until your follow up visit. °Change the surgical dressings daily and reapply a dry dressing each time.  ° °ACTIVITY °Walk with your walker as instructed. °Use walker as long as suggested by your caregivers. °Avoid periods of inactivity such as sitting longer than an hour when not asleep. This helps prevent blood clots.  °You may resume a sexual relationship in one month or when given the OK by your doctor.  °You may return to   work once you are cleared by your doctor.  °Do not drive a car for 6 weeks or until released by you surgeon.  °Do not drive while taking narcotics. ° °WEIGHT BEARING AS TOLERATED ° °POSTOPERATIVE CONSTIPATION PROTOCOL °Constipation - defined medically as fewer than three stools per week and severe constipation as less than one stool per week. ° °One of the most common issues patients have following surgery is  constipation.  Even if you have a regular bowel pattern at home, your normal regimen is likely to be disrupted due to multiple reasons following surgery.  Combination of anesthesia, postoperative narcotics, change in appetite and fluid intake all can affect your bowels.  In order to avoid complications following surgery, here are some recommendations in order to help you during your recovery period. ° °Colace (docusate) - Pick up an over-the-counter form of Colace or another stool softener and take twice a day as long as you are requiring postoperative pain medications.  Take with a full glass of water daily.  If you experience loose stools or diarrhea, hold the colace until you stool forms back up.  If your symptoms do not get better within 1 week or if they get worse, check with your doctor. ° °Dulcolax (bisacodyl) - Pick up over-the-counter and take as directed by the product packaging as needed to assist with the movement of your bowels.  Take with a full glass of water.  Use this product as needed if not relieved by Colace only.  ° °MiraLax (polyethylene glycol) - Pick up over-the-counter to have on hand.  MiraLax is a solution that will increase the amount of water in your bowels to assist with bowel movements.  Take as directed and can mix with a glass of water, juice, soda, coffee, or tea.  Take if you go more than two days without a movement. °Do not use MiraLax more than once per day. Call your doctor if you are still constipated or irregular after using this medication for 7 days in a row. ° °If you continue to have problems with postoperative constipation, please contact the office for further assistance and recommendations.  If you experience "the worst abdominal pain ever" or develop nausea or vomiting, please contact the office immediatly for further recommendations for treatment. ° °ITCHING ° If you experience itching with your medications, try taking only a single pain pill, or even half a pain pill  at a time.  You can also use Benadryl over the counter for itching or also to help with sleep.  ° °TED HOSE STOCKINGS °Wear the elastic stockings on both legs for three weeks following surgery during the day but you may remove then at night for sleeping. ° °MEDICATIONS °See your medication summary on the “After Visit Summary” that the nursing staff will review with you prior to discharge.  You may have some home medications which will be placed on hold until you complete the course of blood thinner medication.  It is important for you to complete the blood thinner medication as prescribed by your surgeon.  Continue your approved medications as instructed at time of discharge. °Do not drive while taking narcotics.  ° °PRECAUTIONS °If you experience chest pain or shortness of breath - call 911 immediately for transfer to the hospital emergency department.  °If you develop a fever greater that 101 F, purulent drainage from wound, increased redness or drainage from wound, foul odor from the wound/dressing, or calf pain - CONTACT YOUR SURGEON.   °                                                °  FOLLOW-UP APPOINTMENTS °Make sure you keep all of your appointments after your operation with your surgeon and caregivers. You should call the office at [phone]  and make an appointment for approximately one week after the date of your surgery or on the date instructed by your surgeon outlined in the "After Visit Summary". ° °RANGE OF MOTION AND STRENGTHENING EXERCISES  °Rehabilitation of the knee is important following a knee injury or an operation. After just a few days of immobilization, the muscles of the thigh which control the knee become weakened and shrink (atrophy). Knee exercises are designed to build up the tone and strength of the thigh muscles and to improve knee motion. Often times heat used for twenty to thirty minutes before working out will loosen up your tissues and help with improving the range of motion  but do not use heat for the first two weeks following surgery. These exercises can be done on a training (exercise) mat, on the floor, on a table or on a bed. Use what ever works the best and is most comfortable for you Knee exercises include: ° °QUAD STRENGTHENING EXERCISES °Strengthening Quadriceps Sets ° °Tighten muscles on top of thigh by pushing knees down into floor or table. °Hold for 20 seconds. Repeat 10 times. °Do 2 sessions per day. ° ° ° ° °Strengthening Terminal Knee Extension ° °With knee bent over bolster, straighten knee by tightening muscle on top of thigh. Be sure to keep bottom of knee on bolster. °Hold for 20 seconds. Repeat 10 times. °Do 2 sessions per day. ° ° °Straight Leg with Bent Knee ° °Lie on back with opposite leg bent. Keep involved knee slightly bent at knee and raise leg 4-6". Hold for 10 seconds. °Repeat 20 times per set. °Do 2 sets per session. °Do 2 sessions per day. ° °

## 2015-07-22 NOTE — Anesthesia Postprocedure Evaluation (Signed)
Anesthesia Post Note  Patient: Phillip Benson  Procedure(s) Performed: Procedure(s) (LRB): RIGHT KNEE ARTHROSCOPY WITH MEDIAL MENISCAL DEBRIDEMENT  (Right)  Patient location during evaluation: PACU Anesthesia Type: General Level of consciousness: awake and alert Pain management: pain level controlled Vital Signs Assessment: post-procedure vital signs reviewed and stable Respiratory status: spontaneous breathing, nonlabored ventilation and respiratory function stable Cardiovascular status: blood pressure returned to baseline and stable Postop Assessment: no signs of nausea or vomiting Anesthetic complications: no    Last Vitals:  Filed Vitals:   07/22/15 1700 07/22/15 1715  BP: 103/61 100/62  Pulse: 64 59  Temp:  36.6 C  Resp: 19 10    Last Pain:  Filed Vitals:   07/22/15 1717  PainSc: 0-No pain                 [name],W. EDMOND

## 2015-07-22 NOTE — Transfer of Care (Signed)
Immediate Anesthesia Transfer of Care Note  Patient: Phillip Benson  Procedure(s) Performed: Procedure(s): RIGHT KNEE ARTHROSCOPY WITH MEDIAL MENISCAL DEBRIDEMENT  (Right)  Patient Location: PACU  Anesthesia Type:General  Level of Consciousness: awake, alert , oriented and patient cooperative  Airway & Oxygen Therapy: Patient Spontanous Breathing and Patient connected to face mask oxygen  Post-op Assessment: Report given to RN, Post -op Vital signs reviewed and stable and Patient moving all extremities X 4  Post vital signs: stable  Last Vitals:  Filed Vitals:   07/22/15 1338  BP: 105/73  Pulse: 71  Temp: 36.9 C  Resp: 18    Last Pain: There were no vitals filed for this visit.    Patients Stated Pain Goal: 5 (07/22/15 1403)  Complications: No apparent anesthesia complications

## 2015-07-22 NOTE — Interval H&P Note (Signed)
History and Physical Interval Note:  07/22/2015 3:08 PM  Phillip Benson  has presented today for surgery, with the diagnosis of RIGHT KNEE LATERAL MENISCAL TEAR   The various methods of treatment have been discussed with the patient and family. After consideration of risks, benefits and other options for treatment, the patient has consented to  Procedure(s): RIGHT KNEE ARTHROSCOPY WITH LATERAL MENISCAL DEBRIDEMENT  (Right) as a surgical intervention .  The patient's history has been reviewed, patient examined, no change in status, stable for surgery.  I have reviewed the patient's chart and labs.  Questions were answered to the patient's satisfaction.     Loanne DrillingALUISIO,Natascha Edmonds V

## 2015-07-22 NOTE — Anesthesia Procedure Notes (Addendum)
Procedure Name: LMA Insertion Date/Time: 07/22/2015 3:37 PM Performed by: Anastasio ChampionEVANS, Breaker Springer E Pre-anesthesia Checklist: Patient identified, Emergency Drugs available, Suction available and Patient being monitored Patient Re-evaluated:Patient Re-evaluated prior to inductionOxygen Delivery Method: Circle System Utilized Preoxygenation: Pre-oxygenation with 100% oxygen Intubation Type: IV induction Ventilation: Mask ventilation without difficulty LMA: LMA inserted LMA Size: 5.0 Tube type: Oral Number of attempts: 1 Placement Confirmation: positive ETCO2 Tube secured with: Tape Dental Injury: Teeth and Oropharynx as per pre-operative assessment    Performed by: Iyana Topor E   Proseal used

## 2015-07-22 NOTE — Anesthesia Preprocedure Evaluation (Addendum)
Anesthesia Evaluation  Patient identified by MRN, date of birth, ID band Patient awake    Reviewed: Allergy & Precautions, H&P , NPO status , Patient's Chart, lab work & pertinent test results  Airway Mallampati: I  TM Distance: >3 FB Neck ROM: Full    Dental no notable dental hx. (+) Teeth Intact, Dental Advisory Given   Pulmonary neg pulmonary ROS,    Pulmonary exam normal breath sounds clear to auscultation       Cardiovascular negative cardio ROS   Rhythm:Regular Rate:Normal     Neuro/Psych negative neurological ROS  negative psych ROS   GI/Hepatic negative GI ROS, Neg liver ROS,   Endo/Other  negative endocrine ROS  Renal/GU negative Renal ROS  negative genitourinary   Musculoskeletal   Abdominal   Peds  Hematology negative hematology ROS (+)   Anesthesia Other Findings   Reproductive/Obstetrics negative OB ROS                            Anesthesia Physical Anesthesia Plan  ASA: I  Anesthesia Plan: General   Post-op Pain Management:    Induction: Intravenous  Airway Management Planned: LMA  Additional Equipment:   Intra-op Plan:   Post-operative Plan: Extubation in OR  Informed Consent: I have reviewed the patients History and Physical, chart, labs and discussed the procedure including the risks, benefits and alternatives for the proposed anesthesia with the patient or authorized representative who has indicated his/her understanding and acceptance.   Dental advisory given  Plan Discussed with: CRNA  Anesthesia Plan Comments:         Anesthesia Quick Evaluation  

## 2015-07-22 NOTE — Op Note (Signed)
Preoperative diagnosis-  Right knee medial meniscal tear  Postoperative diagnosis Right- knee medial meniscal tear   Procedure- Right knee arthroscopy with medial  meniscal debridement    Surgeon- Gus RankinFrank V. Pranathi Winfree, MD  Anesthesia-General  EBL-  Minimal  Complications- None  Condition- PACU - hemodynamically stable.  Brief clinical note- -Elder NegusGlenn R Gorden is a 55 y.o.  male with a several year history of intermittent right knee pain and mechanical symptoms. Exam and history suggested medial meniscal tear confirmed by MRI. The patient presents now for arthroscopy and debridement  Procedure in detail -       After successful administration of General anesthetic, a tourmiquet is placed high on the Right  thigh and the Right lower extremity is prepped and draped in the usual sterile fashion. Time out is performed by the surgical team. Standard superomedial and inferolateral portal sites are marked and incisions made with an 11 blade. The inflow cannula is passed through the superomedial portal and camera through the inferolateral portal and inflow is initiated. Arthroscopic visualization proceeds.      The undersurface of the patella and trochlea are visualized and they are normal. The medial and lateral gutters are visualized and there are  no loose bodies. Flexion and valgus force is applied to the knee and the medial compartment is entered. A spinal needle is passed into the joint through the site marked for the inferomedial portal. A small incision is made and the dilator passed into the joint. The findings for the medial compartment are degenerative unstable tear of posterior horn of medial meniscus. The tear is debrided to a stable base with baskets and a shaver and sealed off with the Arthrocare. It is probed and found to be stable.    The intercondylar notch is visualized and the ACL appears normal. The lateral compartment is entered and the findings are normal .      The joint is again  inspected and there are no other tears, defects or loose bodies identified. The arthroscopic equipment is then removed from the inferior portals which are closed with interrupted 4-0 nylon. 20 ml of .25% Marcaine with epinephrine are injected through the inflow cannula and the cannula is then removed and the portal closed with nylon. The incisions are cleaned and dried and a bulky sterile dressing is applied. The patient is then awakened and transported to recovery in stable condition.   07/22/2015, 4:16 PM

## 2015-07-23 ENCOUNTER — Encounter (HOSPITAL_COMMUNITY): Payer: Self-pay | Admitting: Orthopedic Surgery

## 2016-03-29 DIAGNOSIS — J069 Acute upper respiratory infection, unspecified: Secondary | ICD-10-CM | POA: Diagnosis not present

## 2016-03-29 DIAGNOSIS — H66001 Acute suppurative otitis media without spontaneous rupture of ear drum, right ear: Secondary | ICD-10-CM | POA: Diagnosis not present

## 2016-03-29 DIAGNOSIS — R0602 Shortness of breath: Secondary | ICD-10-CM | POA: Diagnosis not present

## 2016-04-08 DIAGNOSIS — R05 Cough: Secondary | ICD-10-CM | POA: Diagnosis not present

## 2016-05-03 ENCOUNTER — Ambulatory Visit (INDEPENDENT_AMBULATORY_CARE_PROVIDER_SITE_OTHER): Payer: Federal, State, Local not specified - PPO | Admitting: Family Medicine

## 2016-05-03 ENCOUNTER — Encounter: Payer: Self-pay | Admitting: Family Medicine

## 2016-05-03 ENCOUNTER — Ambulatory Visit (HOSPITAL_COMMUNITY)
Admission: RE | Admit: 2016-05-03 | Discharge: 2016-05-03 | Disposition: A | Discharge status: Home / Self Care | Payer: Federal, State, Local not specified - PPO | Source: Ambulatory Visit | Attending: Family Medicine | Admitting: Family Medicine

## 2016-05-03 VITALS — BP 112/74 | HR 76 | Temp 97.8°F | Resp 18 | Ht 71.0 in | Wt 175.0 lb

## 2016-05-03 DIAGNOSIS — J209 Acute bronchitis, unspecified: Secondary | ICD-10-CM | POA: Diagnosis not present

## 2016-05-03 DIAGNOSIS — I700 Atherosclerosis of aorta: Secondary | ICD-10-CM | POA: Diagnosis not present

## 2016-05-03 DIAGNOSIS — R05 Cough: Secondary | ICD-10-CM

## 2016-05-03 DIAGNOSIS — R053 Chronic cough: Secondary | ICD-10-CM

## 2016-05-03 DIAGNOSIS — J42 Unspecified chronic bronchitis: Secondary | ICD-10-CM | POA: Insufficient documentation

## 2016-05-03 MED ORDER — PREDNISONE 20 MG PO TABS
40.0000 mg | ORAL_TABLET | Freq: Every day | ORAL | 0 refills | Status: DC
Start: 2016-05-03 — End: 2019-10-22

## 2016-05-03 MED ORDER — HYDROCODONE-HOMATROPINE 5-1.5 MG/5ML PO SYRP: 5.0000 mL | ORAL_SOLUTION | Freq: Three times a day (TID) | ORAL | 0 refills | Status: DC | PRN

## 2016-05-03 NOTE — Progress Notes (Signed)
Subjective:    Patient ID: Phillip Benson, male    DOB: Oct 04, 1960, 56 y.o.   MRN: [id number]  HPI Patient reports a cough for 6 weeks. Was initially treated at an urgent care with amoxicillin. Cough did not improve. Was then treated again with doxycycline. Cough is still not improved. He denies any fevers or chills. He denies any hemoptysis. He denies any chest pain. He denies any wheezing. He usually gets bronchitis once a year around Christmas. Symptoms started similar to his previous episodes of bronchitis. However now it is developed into a cough that will not stop. He has no, known exposure to whooping cough. He denies any weight loss or fevers or hemoptysis. He denies any shortness of breath. He does have pleurisy in the anterior left lower lobe where he is tender to palpation over her ribs that he believes he may have cracked due to coughing Past Medical History:  Diagnosis Date  . Gastritis and duodenitis    egd-2017  . Personal history of kidney stones 2008   x 1   Past Surgical History:  Procedure Laterality Date  . colonscopy  may 2017   benign polyps removed  . ESOPHAGOGASTRODUODENOSCOPY ENDOSCOPY  may 2017  . KIDNEY STONE SURGERY  2008  . KNEE ARTHROSCOPY WITH LATERAL MENISECTOMY Right 07/22/2015   Procedure: RIGHT KNEE ARTHROSCOPY WITH MEDIAL MENISCAL DEBRIDEMENT ;  Surgeon: Ollen Gross, MD;  Location: [facility];  Service: Orthopedics;  Laterality: Right;   Current Outpatient Prescriptions on File Prior to Visit  Medication Sig Dispense Refill  . acetaminophen (TYLENOL) 325 MG tablet Take 650 mg by mouth every 6 (six) hours as needed.    . Glucosamine-Chondroit-Vit C-Mn (GLUCOSAMINE 1500 COMPLEX PO) Take 1 capsule by mouth daily.    Marland Kitchen HYDROcodone-acetaminophen (NORCO) 5-325 MG tablet Take 1-2 tablets by mouth every 4 (four) hours as needed for moderate pain. 30 tablet 0  . methocarbamol (ROBAXIN) 500 MG tablet Take 1 tablet (500 mg total) by mouth 4 (four) times daily. As  needed for muscle spasm 30 tablet 1  . Multiple Vitamin (MULTIVITAMIN) tablet Take 1 tablet by mouth daily.    . Omega-3 Fatty Acids (FISH OIL) 1000 MG CAPS Take 1,000 mg by mouth daily.      No current facility-administered medications on file prior to visit.    No Known Allergies Social History   Social History  . Marital status: Married    Spouse name: N/A  . Number of children: N/A  . Years of education: N/A   Occupational History  . Not on file.   Social History Main Topics  . Smoking status: Never Smoker  . Smokeless tobacco: Never Used  . Alcohol use No  . Drug use: No  . Sexual activity: Not on file   Other Topics Concern  . Not on file   Social History Narrative  . No narrative on file      Review of Systems  All other systems reviewed and are negative.      Objective:   Physical Exam  Constitutional: He appears well-developed and well-nourished. No distress.  HENT:  Right Ear: External ear normal.  Left Ear: External ear normal.  Nose: Nose normal.  Mouth/Throat: Oropharynx is clear and moist. No oropharyngeal exudate.  Eyes: Conjunctivae are normal.  Neck: Neck supple.  Cardiovascular: Normal rate, regular rhythm and normal heart sounds.   No murmur heard. Pulmonary/Chest: Effort normal and breath sounds normal. No respiratory distress. He has no wheezes.  He has no rales.  Abdominal: Soft. Bowel sounds are normal.  Lymphadenopathy:    He has no cervical adenopathy.  Skin: He is not diaphoretic.  Vitals reviewed.         Assessment & Plan:  Chronic cough - Plan: predniSONE (DELTASONE) 20 MG tablet, DG Chest 2 View  Patient has chronic bronchitis. I do not believe the patient has whooping cough. I would like him to go get a chest x-ray. I believe that this is developed almost into upper airway cough syndrome. We must break the cyclic nature of his cough for the cough to improve. Therefore I recommended Hycodan 1 teaspoon every 6 hours when  necessary cough. I recommended using prednisone 40 mg a day for 7 days for chronic bronchitis to treat the inflammation in his lungs and his airway assuming a normal chest x-ray. Recheck in one week if no better or sooner if worse. If no better, consider laryngo-esophageal reflux.

## 2016-05-04 DIAGNOSIS — K080 Exfoliation of teeth due to systemic causes: Secondary | ICD-10-CM | POA: Diagnosis not present

## 2016-12-15 DIAGNOSIS — K080 Exfoliation of teeth due to systemic causes: Secondary | ICD-10-CM | POA: Diagnosis not present

## 2017-04-11 DIAGNOSIS — J019 Acute sinusitis, unspecified: Secondary | ICD-10-CM | POA: Diagnosis not present

## 2017-04-11 DIAGNOSIS — R05 Cough: Secondary | ICD-10-CM | POA: Diagnosis not present

## 2017-07-03 DIAGNOSIS — K080 Exfoliation of teeth due to systemic causes: Secondary | ICD-10-CM | POA: Diagnosis not present

## 2018-01-08 DIAGNOSIS — K080 Exfoliation of teeth due to systemic causes: Secondary | ICD-10-CM | POA: Diagnosis not present

## 2018-08-06 DIAGNOSIS — K080 Exfoliation of teeth due to systemic causes: Secondary | ICD-10-CM | POA: Diagnosis not present

## 2019-07-04 DIAGNOSIS — Z23 Encounter for immunization: Secondary | ICD-10-CM | POA: Diagnosis not present

## 2019-07-22 DIAGNOSIS — Z03818 Encounter for observation for suspected exposure to other biological agents ruled out: Secondary | ICD-10-CM | POA: Diagnosis not present

## 2019-07-22 DIAGNOSIS — Z20828 Contact with and (suspected) exposure to other viral communicable diseases: Secondary | ICD-10-CM | POA: Diagnosis not present

## 2019-08-01 DIAGNOSIS — Z23 Encounter for immunization: Secondary | ICD-10-CM | POA: Diagnosis not present

## 2019-10-22 ENCOUNTER — Ambulatory Visit (INDEPENDENT_AMBULATORY_CARE_PROVIDER_SITE_OTHER): Payer: Federal, State, Local not specified - PPO | Admitting: Family Medicine

## 2019-10-22 ENCOUNTER — Telehealth: Payer: Self-pay

## 2019-10-22 ENCOUNTER — Other Ambulatory Visit: Payer: Self-pay

## 2019-10-22 ENCOUNTER — Encounter: Payer: Federal, State, Local not specified - PPO | Admitting: Family Medicine

## 2019-10-22 VITALS — BP 102/70 | HR 70 | Temp 96.7°F | Ht 71.0 in | Wt 175.0 lb

## 2019-10-22 DIAGNOSIS — Z0000 Encounter for general adult medical examination without abnormal findings: Secondary | ICD-10-CM

## 2019-10-22 DIAGNOSIS — Z0001 Encounter for general adult medical examination with abnormal findings: Secondary | ICD-10-CM | POA: Diagnosis not present

## 2019-10-22 DIAGNOSIS — M25572 Pain in left ankle and joints of left foot: Secondary | ICD-10-CM | POA: Diagnosis not present

## 2019-10-22 NOTE — Telephone Encounter (Signed)
[facility] is out of Phillip Benson network, so he'd like to try [facility]

## 2019-10-22 NOTE — Telephone Encounter (Signed)
Please have shannon or you call and schedule xray of left ankle. I can't order in epic.

## 2019-10-22 NOTE — Progress Notes (Signed)
Subjective:    Patient ID: Phillip Benson, male    DOB: [birth year], 59 y.o.   MRN: [id number]  HPI Patient is a very pleasant 59 year old Caucasian male who presents today for complete physical exam.  His last colonoscopy was reported to be in 2017.  He was told that it was clear and he does not need a repeat colonoscopy until 2027.  He is due for prostate cancer screening.  His immunizations are up-to-date except for the shingles vaccine.  He has had a Covid shot.  He is due for a flu shot this fall which I recommended.  He politely declines HIV and hepatitis C screening due to a lack of risk factors.  Of note, 4 weeks ago, the patient sprained his left ankle.  It was not inversion injury.  He still has severe pain at the distal end of his left fibula.  When I percuss the end of the fibula, the patient jumps in pain.  I am suspicious he may have an avulsion fracture.  He also complains of dysphagia to solids such as sandwiches or large pills.  Occasionally it feels like it will not go down his throat.  I suspect that he is developing an esophageal stricture due to reflux.  We recommended trying a proton pump inhibitor over-the-counter for 2 to 3 weeks.  If not improving, I would recommend a GI consultation for EGD given his previous history of gastritis.  Otherwise he is doing well with no concerns Immunization History  Administered Date(s) Administered  . Influenza-Unspecified 12/06/2014  . Moderna SARS-COVID-2 Vaccination 07/04/2019, 08/01/2019  . Tdap 04/30/2015    Past Medical History:  Diagnosis Date  . Gastritis and duodenitis    egd-2017  . Personal history of kidney stones 2008   x 1   Past Surgical History:  Procedure Laterality Date  . colonscopy  may 2017   benign polyps removed  . ESOPHAGOGASTRODUODENOSCOPY ENDOSCOPY  may 2017  . KIDNEY STONE SURGERY  2008  . KNEE ARTHROSCOPY WITH LATERAL MENISECTOMY Right 07/22/2015   Procedure: RIGHT KNEE ARTHROSCOPY WITH MEDIAL MENISCAL  DEBRIDEMENT ;  Surgeon: Ollen Gross, MD;  Location: [facility];  Service: Orthopedics;  Laterality: Right;   Current Outpatient Medications on File Prior to Visit  Medication Sig Dispense Refill  . acetaminophen (TYLENOL) 325 MG tablet Take 650 mg by mouth every 6 (six) hours as needed.    . Glucosamine-Chondroit-Vit C-Mn (GLUCOSAMINE 1500 COMPLEX PO) Take 1 capsule by mouth daily.    . Multiple Vitamin (MULTIVITAMIN) tablet Take 1 tablet by mouth daily.    Marland Kitchen HYDROcodone-acetaminophen (NORCO) 5-325 MG tablet Take 1-2 tablets by mouth every 4 (four) hours as needed for moderate pain. (Patient not taking: Reported on 10/22/2019) 30 tablet 0  . HYDROcodone-homatropine (HYCODAN) 5-1.5 MG/5ML syrup Take 5 mLs by mouth every 8 (eight) hours as needed for cough. (Patient not taking: Reported on 10/22/2019) 120 mL 0  . methocarbamol (ROBAXIN) 500 MG tablet Take 1 tablet (500 mg total) by mouth 4 (four) times daily. As needed for muscle spasm (Patient not taking: Reported on 10/22/2019) 30 tablet 1  . Omega-3 Fatty Acids (FISH OIL) 1000 MG CAPS Take 1,000 mg by mouth daily.  (Patient not taking: Reported on 10/22/2019)     No current facility-administered medications on file prior to visit.   No Known Allergies Social History   Socioeconomic History  . Marital status: Married    Spouse name: Not on file  . Number of children:  Not on file  . Years of education: Not on file  . Highest education level: Not on file  Occupational History  . Not on file  Tobacco Use  . Smoking status: Never Smoker  . Smokeless tobacco: Never Used  Substance and Sexual Activity  . Alcohol use: No  . Drug use: No  . Sexual activity: Not on file  Other Topics Concern  . Not on file  Social History Narrative  . Not on file   Social Determinants of Health   Financial Resource Strain:   . Difficulty of Paying Living Expenses:   Food Insecurity:   . Worried About Programme researcher, broadcasting/film/video in the Last Year:   . Engineer, site in the Last Year:   Transportation Needs:   . Freight forwarder (Medical):   Marland Kitchen Lack of Transportation (Non-Medical):   Physical Activity:   . Days of Exercise per Week:   . Minutes of Exercise per Session:   Stress:   . Feeling of Stress :   Social Connections:   . Frequency of Communication with Friends and Family:   . Frequency of Social Gatherings with Friends and Family:   . Attends Religious Services:   . Active Member of Clubs or Organizations:   . Attends Banker Meetings:   Marland Kitchen Marital Status:   Intimate Partner Violence:   . Fear of Current or Ex-Partner:   . Emotionally Abused:   Marland Kitchen Physically Abused:   . Sexually Abused:    Family History  Problem Relation Age of Onset  . Kidney disease Mother   . Heart disease Maternal Grandmother   . Cancer Paternal Grandfather   . Kidney disease Paternal Grandfather       Review of Systems  All other systems reviewed and are negative.      Objective:   Physical Exam Vitals reviewed.  Constitutional:      General: He is not in acute distress.    Appearance: He is well-developed. He is not diaphoretic.  HENT:     Head: Normocephalic and atraumatic.     Right Ear: External ear normal.     Left Ear: External ear normal.     Nose: Nose normal.     Mouth/Throat:     Pharynx: No oropharyngeal exudate.  Eyes:     General: No scleral icterus.       Right eye: No discharge.        Left eye: No discharge.     Conjunctiva/sclera: Conjunctivae normal.     Pupils: Pupils are equal, round, and reactive to light.  Neck:     Thyroid: No thyromegaly.     Vascular: No JVD.     Trachea: No tracheal deviation.  Cardiovascular:     Rate and Rhythm: Normal rate and regular rhythm.     Heart sounds: Normal heart sounds. No murmur heard.  No friction rub. No gallop.   Pulmonary:     Effort: Pulmonary effort is normal. No respiratory distress.     Breath sounds: Normal breath sounds. No stridor. No wheezing or  rales.  Chest:     Chest wall: No tenderness.  Abdominal:     General: Bowel sounds are normal. There is no distension.     Palpations: Abdomen is soft. There is no mass.     Tenderness: There is no abdominal tenderness. There is no guarding or rebound.  Genitourinary:    Prostate: Normal.     Rectum:  Normal.  Musculoskeletal:     Cervical back: Normal range of motion and neck supple.     Left ankle: Tenderness present over the lateral malleolus and ATF ligament. Decreased range of motion.  Lymphadenopathy:     Cervical: No cervical adenopathy.  Skin:    General: Skin is warm.     Coloration: Skin is not pale.     Findings: No erythema or rash.  Neurological:     Mental Status: He is alert and oriented to person, place, and time.     Cranial Nerves: No cranial nerve deficit.     Motor: No abnormal muscle tone.     Coordination: Coordination normal.     Deep Tendon Reflexes: Reflexes are normal and symmetric.  Psychiatric:        Behavior: Behavior normal.        Thought Content: Thought content normal.        Judgment: Judgment normal.           Assessment & Plan:  General medical examination - Plan: CBC with Differential/Platelet, COMPLETE METABOLIC PANEL WITH GFR, Lipid panel, PSA  Acute left ankle pain - Plan: DG Ankle Complete Left  Patient's colonoscopy is up-to-date.  He has had Covid vaccination.  I recommended the shingles shot at his earliest convenience.  I recommended a flu shot this fall.  Tetanus shot is up-to-date.  Patient politely declines hepatitis C and HIV screening.  I will obtain an x-ray of his left ankle as I am concerned he may have a slight avulsion fracture.  I recommended he take a proton pump inhibitor such as omeprazole for 2 to 3 weeks and if his dysphagia is not improving, I would recommend an EGD.  He will call me back and give me an update.  Regular anticipatory guidance is provided

## 2019-10-23 LAB — CBC WITH DIFFERENTIAL/PLATELET
Absolute Monocytes: 475 cells/uL (ref 200–950)
Basophils Absolute: 29 cells/uL (ref 0–200)
Basophils Relative: 0.8 %
Eosinophils Absolute: 158 cells/uL (ref 15–500)
Eosinophils Relative: 4.4 %
HCT: 41.8 % (ref 38.5–50.0)
Hemoglobin: 14 g/dL (ref 13.2–17.1)
Lymphs Abs: 961 cells/uL (ref 850–3900)
MCH: 30.8 pg (ref 27.0–33.0)
MCHC: 33.5 g/dL (ref 32.0–36.0)
MCV: 92.1 fL (ref 80.0–100.0)
MPV: 10.5 fL (ref 7.5–12.5)
Monocytes Relative: 13.2 %
Neutro Abs: 1976 cells/uL (ref 1500–7800)
Neutrophils Relative %: 54.9 %
Platelets: 197 10*3/uL (ref 140–400)
RBC: 4.54 10*6/uL (ref 4.20–5.80)
RDW: 13.6 % (ref 11.0–15.0)
Total Lymphocyte: 26.7 %
WBC: 3.6 10*3/uL — ABNORMAL LOW (ref 3.8–10.8)

## 2019-10-23 LAB — LIPID PANEL
Cholesterol: 190 mg/dL (ref <200)
HDL: 59 mg/dL (ref >=40)
LDL Cholesterol (Calc): 108 mg/dL (calc) — ABNORMAL HIGH
Non-HDL Cholesterol (Calc): 131 mg/dL (calc) — ABNORMAL HIGH (ref <130)
Total CHOL/HDL Ratio: 3.2 (calc) (ref <5.0)
Triglycerides: 121 mg/dL (ref <150)

## 2019-10-23 LAB — PSA: PSA: 1.6 ng/mL (ref <=4.0)

## 2019-10-23 LAB — COMPLETE METABOLIC PANEL WITH GFR
AG Ratio: 2 (calc) (ref 1.0–2.5)
ALT: 17 U/L (ref 9–46)
AST: 19 U/L (ref 10–35)
Albumin: 4.4 g/dL (ref 3.6–5.1)
Alkaline phosphatase (APISO): 59 U/L (ref 35–144)
BUN: 13 mg/dL (ref 7–25)
CO2: 29 mmol/L (ref 20–32)
Calcium: 9.4 mg/dL (ref 8.6–10.3)
Chloride: 104 mmol/L (ref 98–110)
Creat: 0.93 mg/dL (ref 0.70–1.33)
GFR, Est African American: 104 mL/min/{1.73_m2} (ref >=60)
GFR, Est Non African American: 90 mL/min/{1.73_m2} (ref >=60)
Globulin: 2.2 g/dL (calc) (ref 1.9–3.7)
Glucose, Bld: 84 mg/dL (ref 65–99)
Potassium: 4.8 mmol/L (ref 3.5–5.3)
Sodium: 140 mmol/L (ref 135–146)
Total Bilirubin: 0.6 mg/dL (ref 0.2–1.2)
Total Protein: 6.6 g/dL (ref 6.1–8.1)

## 2019-10-23 NOTE — Telephone Encounter (Signed)
Pt xray order faxed to [facility] on 10-22-2019

## 2019-10-24 DIAGNOSIS — M25572 Pain in left ankle and joints of left foot: Secondary | ICD-10-CM | POA: Diagnosis not present

## 2019-10-24 NOTE — Telephone Encounter (Signed)
CB# [phone] Pt still waiting for xray  Left ankle order need to be send to [facility] on [address] they are in his network not [facility]

## 2019-10-28 ENCOUNTER — Telehealth: Payer: Self-pay | Admitting: Family Medicine

## 2019-10-28 NOTE — Telephone Encounter (Signed)
CB# [phone] Call for Xray results

## 2019-10-30 NOTE — Telephone Encounter (Signed)
Xray order was sent to [facility] fax was confirmed successful.

## 2019-10-31 NOTE — Telephone Encounter (Signed)
CB#  [phone] Call for lab results

## 2019-10-31 NOTE — Telephone Encounter (Signed)
Labs have not been released

## 2019-11-05 NOTE — Telephone Encounter (Signed)
Wife called asking for the results of the xray that patient had done several weeks ago. Please call back at [phone]

## 2019-11-08 NOTE — Telephone Encounter (Signed)
Spoke with Pt with xray results, he verbalized understanding of results given

## 2020-02-11 DIAGNOSIS — Z23 Encounter for immunization: Secondary | ICD-10-CM | POA: Diagnosis not present

## 2020-04-16 DIAGNOSIS — R002 Palpitations: Secondary | ICD-10-CM | POA: Diagnosis not present

## 2020-09-21 DIAGNOSIS — H16142 Punctate keratitis, left eye: Secondary | ICD-10-CM | POA: Diagnosis not present

## 2020-09-21 DIAGNOSIS — H1132 Conjunctival hemorrhage, left eye: Secondary | ICD-10-CM | POA: Diagnosis not present

## 2020-10-27 ENCOUNTER — Ambulatory Visit (INDEPENDENT_AMBULATORY_CARE_PROVIDER_SITE_OTHER): Payer: Federal, State, Local not specified - PPO | Admitting: Family Medicine

## 2020-10-27 ENCOUNTER — Other Ambulatory Visit: Payer: Self-pay

## 2020-10-27 VITALS — BP 115/78 | HR 65 | Temp 97.2°F | Ht 71.0 in | Wt 173.0 lb

## 2020-10-27 DIAGNOSIS — Z0001 Encounter for general adult medical examination with abnormal findings: Secondary | ICD-10-CM | POA: Diagnosis not present

## 2020-10-27 DIAGNOSIS — I493 Ventricular premature depolarization: Secondary | ICD-10-CM | POA: Diagnosis not present

## 2020-10-27 DIAGNOSIS — Z125 Encounter for screening for malignant neoplasm of prostate: Secondary | ICD-10-CM | POA: Diagnosis not present

## 2020-10-27 DIAGNOSIS — Z0000 Encounter for general adult medical examination without abnormal findings: Secondary | ICD-10-CM | POA: Diagnosis not present

## 2020-10-27 DIAGNOSIS — Z13220 Encounter for screening for lipoid disorders: Secondary | ICD-10-CM | POA: Diagnosis not present

## 2020-10-27 DIAGNOSIS — R55 Syncope and collapse: Secondary | ICD-10-CM

## 2020-10-27 MED ORDER — METOPROLOL TARTRATE 25 MG PO TABS
25.0000 mg | ORAL_TABLET | Freq: Two times a day (BID) | ORAL | 0 refills | Status: DC | PRN
Start: 2020-10-27 — End: 2020-11-05

## 2020-10-27 NOTE — Progress Notes (Signed)
Subjective:    Patient ID: Phillip Benson, male    DOB: 1960-04-27, 60 y.o.   MRN: [id number]  HPI Patient is a very pleasant 60 year old Caucasian male who presents today for complete physical exam.  His last colonoscopy was reported to be in 2017.  He was told that it was clear and he does not need a repeat colonoscopy until 2027.  He is due for prostate cancer screening.  His immunizations are up-to-date except for the shingles vaccine.  He has had a Covid shot.  He is due for a flu shot this fall which I recommended.  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  He believes that every fourth heartbeat was a PVC and this lasted for more than a day.  His neighbor who is a PA told him that the was having PVCs.  He describes a "flip-flop" feeling in his chest every third or fourth heartbeat that made him feel like he needed to take a deep breath.  He denied any chest pain shortness of breath dyspnea on exertion.  He denied any lightheadedness or syncope.  Recently he did have an episode that sounds like vasovagal near syncope.  He was working in the yard when his hands began to tingle.  He started to feel lightheaded.  He started to feel dizzy.  He started feel like he was going to pass out.  He had to lay down on the ground.  He lifted his legs and his symptoms subsided once he was in Trendelenburg.  Symptoms lasted a few minutes and then subsided.  He states that this is the only time that is happened.  He exercises every day and runs or walks several miles.  He states that he is extremely physically active and he denies any angina or chest pain or palpitations or shortness of breath other than those 2 events Immunization History  Administered Date(s) Administered   Influenza-Unspecified 12/06/2014   Moderna Sars-Covid-2 Vaccination 07/04/2019, 08/01/2019   Tdap 04/30/2015    Past Medical History:  Diagnosis Date   Gastritis and duodenitis    egd-2017   Personal history of kidney stones  2008   x 1   Past Surgical History:  Procedure Laterality Date   colonscopy  may 2017   benign polyps removed   ESOPHAGOGASTRODUODENOSCOPY ENDOSCOPY  may 2017   KIDNEY STONE SURGERY  2008   KNEE ARTHROSCOPY WITH LATERAL MENISECTOMY Right 07/22/2015   Procedure: RIGHT KNEE ARTHROSCOPY WITH MEDIAL MENISCAL DEBRIDEMENT ;  Surgeon: Ollen Gross, MD;  Location: [facility];  Service: Orthopedics;  Laterality: Right;   No current outpatient medications on file prior to visit.   No current facility-administered medications on file prior to visit.     No Known Allergies Social History   Socioeconomic History   Marital status: Married    Spouse name: Not on file   Number of children: Not on file   Years of education: Not on file   Highest education level: Not on file  Occupational History   Not on file  Tobacco Use   Smoking status: Never   Smokeless tobacco: Never  Substance and Sexual Activity   Alcohol use: No   Drug use: No   Sexual activity: Not on file  Other Topics Concern   Not on file  Social History Narrative   Not on file   Social Determinants of Health   Financial Resource Strain: Not on file  Food Insecurity: Not on file  Transportation Needs:  Not on file  Physical Activity: Not on file  Stress: Not on file  Social Connections: Not on file  Intimate Partner Violence: Not on file   Family History  Problem Relation Age of Onset   Kidney disease Mother    Heart disease Maternal Grandmother    Cancer Paternal Grandfather    Kidney disease Paternal Grandfather       Review of Systems  All other systems reviewed and are negative.     Objective:   Physical Exam Vitals reviewed.  Constitutional:      General: He is not in acute distress.    Appearance: He is well-developed. He is not diaphoretic.  HENT:     Head: Normocephalic and atraumatic.     Right Ear: External ear normal.     Left Ear: External ear normal.     Nose: Nose normal.      Mouth/Throat:     Pharynx: No oropharyngeal exudate.  Eyes:     General: No scleral icterus.       Right eye: No discharge.        Left eye: No discharge.     Conjunctiva/sclera: Conjunctivae normal.     Pupils: Pupils are equal, round, and reactive to light.  Neck:     Thyroid: No thyromegaly.     Vascular: No JVD.     Trachea: No tracheal deviation.  Cardiovascular:     Rate and Rhythm: Normal rate and regular rhythm.     Heart sounds: Normal heart sounds. No murmur heard.   No friction rub. No gallop.  Pulmonary:     Effort: Pulmonary effort is normal. No respiratory distress.     Breath sounds: Normal breath sounds. No stridor. No wheezing or rales.  Chest:     Chest wall: No tenderness.  Abdominal:     General: Bowel sounds are normal. There is no distension.     Palpations: Abdomen is soft. There is no mass.     Tenderness: There is no abdominal tenderness. There is no guarding or rebound.  Genitourinary:    Prostate: Normal.     Rectum: Normal.  Musculoskeletal:     Cervical back: Normal range of motion and neck supple.  Lymphadenopathy:     Cervical: No cervical adenopathy.  Skin:    General: Skin is warm.     Coloration: Skin is not pale.     Findings: No erythema or rash.  Neurological:     Mental Status: He is alert and oriented to person, place, and time.     Cranial Nerves: No cranial nerve deficit.     Motor: No abnormal muscle tone.     Coordination: Coordination normal.     Deep Tendon Reflexes: Reflexes are normal and symmetric.  Psychiatric:        Behavior: Behavior normal.        Thought Content: Thought content normal.        Judgment: Judgment normal.          Assessment & Plan:  General medical examination - Plan: CBC with Differential/Platelet, COMPLETE METABOLIC PANEL WITH GFR, Lipid panel, PSA  Prostate cancer screening - Plan: PSA  Screening cholesterol level - Plan: CBC with Differential/Platelet, COMPLETE METABOLIC PANEL WITH GFR,  Lipid panel  PVC (premature ventricular contraction) - Plan: TSH  Near syncope - Plan: ECHOCARDIOGRAM COMPLETE Screen for prostate cancer with a PSA.  Patient's colonoscopy is up-to-date.  I will check a CBC CMP fasting lipid panel and  given the frequent PVCs I will check a TSH.  Because of his PVCs I will give the patient metoprolol 25 mg tablets.  He can take 1 tablet twice daily as needed for PVCs.  I will schedule the patient for an echocardiogram to evaluate for any structural abnormalities in the heart.  If symptoms recur I would recommend a cardiac monitor to evaluate for any other underlying arrhythmias.  At the present time, the patient is comfortable with this plan and declines a referral to cardiology for stress test.

## 2020-10-28 LAB — CBC WITH DIFFERENTIAL/PLATELET
Absolute Monocytes: 396 cells/uL (ref 200–950)
Basophils Absolute: 12 cells/uL (ref 0–200)
Basophils Relative: 0.2 %
Eosinophils Absolute: 120 cells/uL (ref 15–500)
Eosinophils Relative: 2 %
HCT: 53.3 % — ABNORMAL HIGH (ref 38.5–50.0)
Hemoglobin: 18.2 g/dL — ABNORMAL HIGH (ref 13.2–17.1)
Lymphs Abs: 1488 cells/uL (ref 850–3900)
MCH: 30.9 pg (ref 27.0–33.0)
MCHC: 34.1 g/dL (ref 32.0–36.0)
MCV: 90.5 fL (ref 80.0–100.0)
MPV: 10.4 fL (ref 7.5–12.5)
Monocytes Relative: 6.6 %
Neutro Abs: 3984 cells/uL (ref 1500–7800)
Neutrophils Relative %: 66.4 %
Platelets: 245 10*3/uL (ref 140–400)
RBC: 5.89 10*6/uL — ABNORMAL HIGH (ref 4.20–5.80)
RDW: 12.5 % (ref 11.0–15.0)
Total Lymphocyte: 24.8 %
WBC: 6 10*3/uL (ref 3.8–10.8)

## 2020-10-28 LAB — COMPLETE METABOLIC PANEL WITH GFR
AG Ratio: 2.1 (calc) (ref 1.0–2.5)
ALT: 16 U/L (ref 9–46)
AST: 18 U/L (ref 10–35)
Albumin: 4.5 g/dL (ref 3.6–5.1)
Alkaline phosphatase (APISO): 58 U/L (ref 35–144)
BUN: 14 mg/dL (ref 7–25)
CO2: 28 mmol/L (ref 20–32)
Calcium: 9.4 mg/dL (ref 8.6–10.3)
Chloride: 104 mmol/L (ref 98–110)
Creat: 0.88 mg/dL (ref 0.70–1.35)
Globulin: 2.1 g/dL (calc) (ref 1.9–3.7)
Glucose, Bld: 87 mg/dL (ref 65–99)
Potassium: 5.1 mmol/L (ref 3.5–5.3)
Sodium: 141 mmol/L (ref 135–146)
Total Bilirubin: 0.8 mg/dL (ref 0.2–1.2)
Total Protein: 6.6 g/dL (ref 6.1–8.1)
eGFR: 98 mL/min/{1.73_m2} (ref >=60)

## 2020-10-28 LAB — LIPID PANEL
Cholesterol: 193 mg/dL (ref <200)
HDL: 58 mg/dL (ref >=40)
LDL Cholesterol (Calc): 110 mg/dL (calc) — ABNORMAL HIGH
Non-HDL Cholesterol (Calc): 135 mg/dL (calc) — ABNORMAL HIGH (ref <130)
Total CHOL/HDL Ratio: 3.3 (calc) (ref <5.0)
Triglycerides: 139 mg/dL (ref <150)

## 2020-10-28 LAB — PSA: PSA: 2.07 ng/mL (ref <=4.00)

## 2020-10-28 LAB — TSH: TSH: 2.44 mIU/L (ref 0.40–4.50)

## 2020-11-02 ENCOUNTER — Other Ambulatory Visit: Payer: Self-pay | Admitting: *Deleted

## 2020-11-02 DIAGNOSIS — D582 Other hemoglobinopathies: Secondary | ICD-10-CM

## 2020-11-04 ENCOUNTER — Other Ambulatory Visit: Payer: Self-pay | Admitting: Family Medicine

## 2020-11-17 ENCOUNTER — Other Ambulatory Visit (HOSPITAL_COMMUNITY): Payer: Federal, State, Local not specified - PPO

## 2020-11-20 ENCOUNTER — Ambulatory Visit (HOSPITAL_COMMUNITY): Payer: Federal, State, Local not specified - PPO | Admitting: Hematology and Oncology

## 2020-11-27 ENCOUNTER — Other Ambulatory Visit: Payer: Self-pay | Admitting: Family Medicine

## 2020-11-27 ENCOUNTER — Ambulatory Visit (HOSPITAL_COMMUNITY): Payer: Federal, State, Local not specified - PPO | Admitting: Hematology and Oncology

## 2020-12-03 ENCOUNTER — Other Ambulatory Visit: Payer: Self-pay

## 2020-12-03 ENCOUNTER — Ambulatory Visit (HOSPITAL_COMMUNITY): Payer: Federal, State, Local not specified - PPO | Attending: Cardiology

## 2020-12-03 DIAGNOSIS — R55 Syncope and collapse: Secondary | ICD-10-CM | POA: Insufficient documentation

## 2020-12-03 LAB — ECHOCARDIOGRAM COMPLETE
Area-P 1/2: 3.91 cm2
S' Lateral: 3.3 cm

## 2020-12-04 ENCOUNTER — Encounter (HOSPITAL_COMMUNITY): Payer: Self-pay | Admitting: Hematology

## 2020-12-04 ENCOUNTER — Other Ambulatory Visit: Payer: Self-pay

## 2020-12-04 ENCOUNTER — Inpatient Hospital Stay (HOSPITAL_COMMUNITY): Payer: Federal, State, Local not specified - PPO | Attending: Hematology and Oncology | Admitting: Hematology

## 2020-12-04 ENCOUNTER — Inpatient Hospital Stay (HOSPITAL_COMMUNITY): Payer: Federal, State, Local not specified - PPO

## 2020-12-04 DIAGNOSIS — Z87442 Personal history of urinary calculi: Secondary | ICD-10-CM | POA: Insufficient documentation

## 2020-12-04 DIAGNOSIS — Z801 Family history of malignant neoplasm of trachea, bronchus and lung: Secondary | ICD-10-CM | POA: Diagnosis not present

## 2020-12-04 DIAGNOSIS — Z8616 Personal history of COVID-19: Secondary | ICD-10-CM | POA: Diagnosis not present

## 2020-12-04 DIAGNOSIS — Z809 Family history of malignant neoplasm, unspecified: Secondary | ICD-10-CM | POA: Insufficient documentation

## 2020-12-04 DIAGNOSIS — Z8719 Personal history of other diseases of the digestive system: Secondary | ICD-10-CM | POA: Diagnosis not present

## 2020-12-04 DIAGNOSIS — D751 Secondary polycythemia: Secondary | ICD-10-CM | POA: Diagnosis not present

## 2020-12-04 LAB — CBC WITH DIFFERENTIAL/PLATELET
Abs Immature Granulocytes: 0.01 10*3/uL (ref 0.00–0.07)
Basophils Absolute: 0 10*3/uL (ref 0.0–0.1)
Basophils Relative: 0 %
Eosinophils Absolute: 0.1 10*3/uL (ref 0.0–0.5)
Eosinophils Relative: 2 %
HCT: 50.8 % (ref 39.0–52.0)
Hemoglobin: 16.7 g/dL (ref 13.0–17.0)
Immature Granulocytes: 0 %
Lymphocytes Relative: 20 %
Lymphs Abs: 1.3 10*3/uL (ref 0.7–4.0)
MCH: 31.2 pg (ref 26.0–34.0)
MCHC: 32.9 g/dL (ref 30.0–36.0)
MCV: 95 fL (ref 80.0–100.0)
Monocytes Absolute: 0.5 10*3/uL (ref 0.1–1.0)
Monocytes Relative: 7 %
Neutro Abs: 4.8 10*3/uL (ref 1.7–7.7)
Neutrophils Relative %: 71 %
Platelets: 264 10*3/uL (ref 150–400)
RBC: 5.35 MIL/uL (ref 4.22–5.81)
RDW: 13.4 % (ref 11.5–15.5)
WBC: 6.7 10*3/uL (ref 4.0–10.5)
nRBC: 0 % (ref 0.0–0.2)

## 2020-12-04 NOTE — Progress Notes (Signed)
CONSULT NOTE  Patient Care Team: Susy Frizzle, MD as PCP - General (Family Medicine)  CHIEF COMPLAINTS/PURPOSE OF CONSULTATION:  Erythrocytosis.  HISTORY OF PRESENTING ILLNESS:  Phillip Benson 60 y.o. male is seen in consultation today at the request of Dr. Dennard Schaumann for erythrocytosis.  He was found to have an abnormal CBC on 10/27/2020 when his hemoglobin was 18.2 and hematocrit 53.3.  RBC count was also elevated at 5.89.  White count and platelet count was normal.  He denies any history of blood transfusion.  He donated blood x2 in the remote past and passed out each time.  He had PVC in March for couple of days and takes metoprolol as needed.  He has been battling kidney stone for the last few days.  He had COVID 3 weeks ago and still has some cough from it.  Denies any aquagenic pruritus.  Denies any vasomotor symptoms.  [place]  He has a house in [place] and visits his parents every week.  Father reportedly has dementia.  He works for [employer].  Denies any smoking history.  Family history significant for lung cancer in paternal grandfather.  No family history of polycythemia.   MEDICAL HISTORY:  Past Medical History:  Diagnosis Date   Gastritis and duodenitis    egd-2017   Personal history of kidney stones 2008   x 1    SURGICAL HISTORY: Past Surgical History:  Procedure Laterality Date   colonscopy  may 2017   benign polyps removed   ESOPHAGOGASTRODUODENOSCOPY ENDOSCOPY  may 2017   KIDNEY STONE SURGERY  2008   KNEE ARTHROSCOPY WITH LATERAL MENISECTOMY Right 07/22/2015   Procedure: RIGHT KNEE ARTHROSCOPY WITH MEDIAL MENISCAL DEBRIDEMENT ;  Surgeon: Gaynelle Arabian, MD;  Location: [facility];  Service: Orthopedics;  Laterality: Right;    SOCIAL HISTORY: Social History   Socioeconomic History   Marital status: Married    Spouse name: Not on file   Number of children: Not on file   Years of education: Not on file   Highest  education level: Not on file  Occupational History   Not on file  Tobacco Use   Smoking status: Never   Smokeless tobacco: Never  Substance and Sexual Activity   Alcohol use: No   Drug use: No   Sexual activity: Not on file  Other Topics Concern   Not on file  Social History Narrative   Not on file   Social Determinants of Health   Financial Resource Strain: Not on file  Food Insecurity: Not on file  Transportation Needs: Not on file  Physical Activity: Not on file  Stress: Not on file  Social Connections: Not on file  Intimate Partner Violence: Not on file    FAMILY HISTORY: Family History  Problem Relation Age of Onset   Kidney disease Mother    Heart disease Maternal Grandmother    Cancer Paternal Grandfather    Kidney disease Paternal Grandfather     ALLERGIES:  has No Known Allergies.  MEDICATIONS:  Current Outpatient Medications  Medication Sig Dispense Refill   metoprolol tartrate (LOPRESSOR) 25 MG tablet TAKE 1 TABLET BY MOUTH 2 TIMES DAILY AS NEEDED. 30 tablet 0   No current facility-administered medications for this visit.    REVIEW OF SYSTEMS:   Constitutional: Denies fevers, chills or abnormal night sweats Eyes: Denies blurriness of vision, double vision or watery eyes Ears, nose, mouth, throat, and face:  Denies mucositis or sore throat Respiratory: Positive for cough. Cardiovascular: Denies palpitation, chest discomfort or lower extremity swelling Gastrointestinal:  Denies nausea, heartburn or change in bowel habits Skin: Denies abnormal skin rashes Lymphatics: Denies new lymphadenopathy or easy bruising Neurological:Denies numbness, tingling or new weaknesses Behavioral/Psych: Mood is stable, no new changes  All other systems were reviewed with the patient and are negative.  PHYSICAL EXAMINATION: ECOG PERFORMANCE STATUS: 0 - Asymptomatic  Vitals:   12/04/20 0938  BP: 112/74  Pulse: 71  Resp: 16  Temp: 98.1 F (36.7 C)  SpO2: 97%    Filed Weights   12/04/20 0938  Weight: 170 lb 14.4 oz (77.5 kg)    GENERAL:alert, no distress and comfortable SKIN: skin color, texture, turgor are normal, no rashes or significant lesions EYES: normal, conjunctiva are pink and non-injected, sclera clear OROPHARYNX:no exudate, no erythema and lips, buccal mucosa, and tongue normal  NECK: supple, thyroid normal size, non-tender, without nodularity LYMPH:  no palpable lymphadenopathy in the cervical, axillary or inguinal LUNGS: clear to auscultation and percussion with normal breathing effort HEART: regular rate & rhythm and no murmurs and no lower extremity edema ABDOMEN:abdomen soft, non-tender and normal bowel sounds Musculoskeletal:no cyanosis of digits and no clubbing  PSYCH: alert & oriented x 3 with fluent speech NEURO: no focal motor/sensory deficits  LABORATORY DATA:  I have reviewed the data as listed Recent Results (from the past 2160 hour(s))  CBC with Differential/Platelet     Status: Abnormal   Collection Time: 10/27/20  9:13 AM  Result Value Ref Range   WBC 6.0 3.8 - 10.8 Thousand/uL   RBC 5.89 (H) 4.20 - 5.80 Million/uL   Hemoglobin 18.2 (H) 13.2 - 17.1 g/dL   HCT 53.3 (H) 38.5 - 50.0 %   MCV 90.5 80.0 - 100.0 fL   MCH 30.9 27.0 - 33.0 pg   MCHC 34.1 32.0 - 36.0 g/dL   RDW 12.5 11.0 - 15.0 %   Platelets 245 140 - 400 Thousand/uL   MPV 10.4 7.5 - 12.5 fL   Neutro Abs 3,984 1,500 - 7,800 cells/uL   Lymphs Abs 1,488 850 - 3,900 cells/uL   Absolute Monocytes 396 200 - 950 cells/uL   Eosinophils Absolute 120 15 - 500 cells/uL   Basophils Absolute 12 0 - 200 cells/uL   Neutrophils Relative % 66.4 %   Total Lymphocyte 24.8 %   Monocytes Relative 6.6 %   Eosinophils Relative 2.0 %   Basophils Relative 0.2 %  COMPLETE METABOLIC PANEL WITH GFR     Status: None   Collection Time: 10/27/20  9:13 AM  Result Value Ref Range   Glucose, Bld 87 65 - 99 mg/dL    Comment: .            Fasting reference interval .     BUN 14 7 - 25 mg/dL   Creat 0.88 0.70 - 1.35 mg/dL   eGFR 98 > OR = 60 mL/min/1.9m    Comment: The eGFR is based on the CKD-EPI 2021 equation. To calculate  the new eGFR from a previous Creatinine or Cystatin C result, go to https://www.kidney.org/professionals/ kdoqi/gfr%5Fcalculator    BUN/Creatinine Ratio NOT APPLICABLE 6 - 22 (calc)   Sodium 141 135 - 146 mmol/L   Potassium 5.1 3.5 - 5.3 mmol/L   Chloride 104 98 - 110 mmol/L   CO2 28 20 - 32 mmol/L   Calcium 9.4 8.6 - 10.3 mg/dL   Total Protein 6.6 6.1 - 8.1 g/dL  Albumin 4.5 3.6 - 5.1 g/dL   Globulin 2.1 1.9 - 3.7 g/dL (calc)   AG Ratio 2.1 1.0 - 2.5 (calc)   Total Bilirubin 0.8 0.2 - 1.2 mg/dL   Alkaline phosphatase (APISO) 58 35 - 144 U/L   AST 18 10 - 35 U/L   ALT 16 9 - 46 U/L  Lipid panel     Status: Abnormal   Collection Time: 10/27/20  9:13 AM  Result Value Ref Range   Cholesterol 193 <200 mg/dL   HDL 58 > OR = 40 mg/dL   Triglycerides 139 <150 mg/dL   LDL Cholesterol (Calc) 110 (H) mg/dL (calc)    Comment: Reference range: <100 . Desirable range <100 mg/dL for primary prevention;   <70 mg/dL for patients with CHD or diabetic patients  with > or = 2 CHD risk factors. Marland Kitchen LDL-C is now calculated using the Martin-Hopkins  calculation, which is a validated novel method providing  better accuracy than the Friedewald equation in the  estimation of LDL-C.  Cresenciano Genre et al. Annamaria Helling. 6286;381(77): 2061-2068  (http://education.[facility].com/faq/FAQ164)    Total CHOL/HDL Ratio 3.3 <5.0 (calc)   Non-HDL Cholesterol (Calc) 135 (H) <130 mg/dL (calc)    Comment: For patients with diabetes plus 1 major ASCVD risk  factor, treating to a non-HDL-C goal of <100 mg/dL  (LDL-C of <70 mg/dL) is considered a therapeutic  option.   PSA     Status: None   Collection Time: 10/27/20  9:13 AM  Result Value Ref Range   PSA 2.07 < OR = 4.00 ng/mL    Comment: The total PSA value from this assay system is  standardized against  the WHO standard. The test  result will be approximately 20% lower when compared  to the equimolar-standardized total PSA (Beckman  Coulter). Comparison of serial PSA results should be  interpreted with this fact in mind. . This test was performed using the Siemens  chemiluminescent method. Values obtained from  different assay methods cannot be used interchangeably. PSA levels, regardless of value, should not be interpreted as absolute evidence of the presence or absence of disease.   TSH     Status: None   Collection Time: 10/27/20  9:13 AM  Result Value Ref Range   TSH 2.44 0.40 - 4.50 mIU/L  ECHOCARDIOGRAM COMPLETE     Status: None   Collection Time: 12/03/20  2:42 PM  Result Value Ref Range   Area-P 1/2 3.91 cm2   S' Lateral 3.30 cm  CBC with Differential     Status: None   Collection Time: 12/04/20 10:24 AM  Result Value Ref Range   WBC 6.7 4.0 - 10.5 K/uL   RBC 5.35 4.22 - 5.81 MIL/uL   Hemoglobin 16.7 13.0 - 17.0 g/dL   HCT 50.8 39.0 - 52.0 %   MCV 95.0 80.0 - 100.0 fL   MCH 31.2 26.0 - 34.0 pg   MCHC 32.9 30.0 - 36.0 g/dL   RDW 13.4 11.5 - 15.5 %   Platelets 264 150 - 400 K/uL   nRBC 0.0 0.0 - 0.2 %   Neutrophils Relative % 71 %   Neutro Abs 4.8 1.7 - 7.7 K/uL   Lymphocytes Relative 20 %   Lymphs Abs 1.3 0.7 - 4.0 K/uL   Monocytes Relative 7 %   Monocytes Absolute 0.5 0.1 - 1.0 K/uL   Eosinophils Relative 2 %   Eosinophils Absolute 0.1 0.0 - 0.5 K/uL   Basophils Relative 0 %  Basophils Absolute 0.0 0.0 - 0.1 K/uL   Immature Granulocytes 0 %   Abs Immature Granulocytes 0.01 0.00 - 0.07 K/uL    Comment: Performed at [facility], [address] [place], [place] [address]    RADIOGRAPHIC STUDIES: I have personally reviewed the radiological images as listed and agreed with the findings in the report. ECHOCARDIOGRAM COMPLETE  Result Date: 12/03/2020    ECHOCARDIOGRAM REPORT   Patient Name:   Phillip Benson Date of Exam: 12/03/2020 Medical Rec #:  [id number]        Height:       71.0 in Accession #:    [id number]      Weight:       173.0 lb Date of Birth:  13-Jan-1961       BSA:          1.983 m Patient Age:    50 years        BP:           124/82 mmHg Patient Gender: M               HR:           77 bpm. Exam Location:  [address] Procedure: 2D Echo, 3D Echo, Cardiac Doppler, Color Doppler and Strain Analysis Indications:    R55 Syncope  History:        Patient has no prior history of Echocardiogram examinations.  Sonographer:    Wilford Sports Rodgers-Jones RDCS Referring Phys: [place]  1. Left ventricular ejection fraction by 3D volume is 55 %. The left ventricle has normal function. The left ventricle has no regional wall motion abnormalities. Left ventricular diastolic parameters are consistent with Grade I diastolic dysfunction (impaired relaxation). The average left ventricular global longitudinal strain is -24.7 %. The global longitudinal strain is normal.  2. Right ventricular systolic function is normal. The right ventricular size is normal.  3. The mitral valve is normal in structure. No evidence of mitral valve regurgitation. No evidence of mitral stenosis.  4. The aortic valve is normal in structure. Aortic valve regurgitation is not visualized. No aortic stenosis is present.  5. The inferior vena cava is normal in size with greater than 50% respiratory variability, suggesting right atrial pressure of 3 mmHg. FINDINGS  Left Ventricle: Left ventricular ejection fraction by 3D volume is 55 %. The left ventricle has normal function. The left ventricle has no regional wall motion abnormalities. The average left ventricular global longitudinal strain is -24.7 %. The global  longitudinal strain is normal. The left ventricular internal cavity size was normal in size. There is no left ventricular hypertrophy. Left ventricular diastolic parameters are consistent with Grade I diastolic dysfunction (impaired relaxation). Right Ventricle: The right  ventricular size is normal. No increase in right ventricular wall thickness. Right ventricular systolic function is normal. Left Atrium: Left atrial size was normal in size. Right Atrium: Right atrial size was normal in size. Pericardium: There is no evidence of pericardial effusion. Mitral Valve: The mitral valve is normal in structure. No evidence of mitral valve regurgitation. No evidence of mitral valve stenosis. Tricuspid Valve: The tricuspid valve is normal in structure. Tricuspid valve regurgitation is not demonstrated. No evidence of tricuspid stenosis. Aortic Valve: The aortic valve is normal in structure. Aortic valve regurgitation is not visualized. No aortic stenosis is present. Pulmonic Valve: The pulmonic valve was normal in structure. Pulmonic valve regurgitation is not visualized. No evidence of pulmonic stenosis. Aorta: The aortic  root is normal in size and structure. Venous: The inferior vena cava is normal in size with greater than 50% respiratory variability, suggesting right atrial pressure of 3 mmHg. IAS/Shunts: No atrial level shunt detected by color flow Doppler.  LEFT VENTRICLE PLAX 2D LVIDd:         5.60 cm         Diastology LVIDs:         3.30 cm         LV e' medial:    8.81 cm/s LV PW:         0.70 cm         LV E/e' medial:  9.6 LV IVS:        0.70 cm         LV e' lateral:   12.40 cm/s LVOT diam:     2.00 cm         LV E/e' lateral: 6.9 LV SV:         51 LV SV Index:   26              2D LVOT Area:     3.14 cm        Longitudinal                                Strain                                2D Strain GLS  -24.7 %                                (A2C):                                2D Strain GLS  -24.5 %                                (A3C):                                2D Strain GLS  -24.8 %                                (A4C):                                2D Strain GLS  -24.7 %                                Avg:                                 3D Volume EF                                 LV 3D EF:    Left  ventricul                                             ar                                             ejection                                             fraction                                             by 3D                                             volume is                                             55 %.                                 3D Volume EF:                                3D EF:        55 %                                LV EDV:       110 ml                                LV ESV:       49 ml                                LV SV:        61 ml RIGHT VENTRICLE RV Basal diam:  3.70 cm RV S prime:     18.50 cm/s TAPSE (M-mode): 3.0 cm LEFT ATRIUM             Index       RIGHT ATRIUM           Index LA diam:        3.60 cm 1.82 cm/m  RA Area:     14.90 cm LA Vol (A2C):   45.5 ml 22.95 ml/m RA Volume:   42.00 ml  21.19 ml/m LA Vol (A4C):   23.2 ml 11.70 ml/m LA Biplane Vol: 32.6 ml 16.44 ml/m  AORTIC VALVE LVOT Vmax:   83.83 cm/s LVOT Vmean:  55.067 cm/s  LVOT VTI:    0.163 m  AORTA Ao Root diam: 3.40 cm Ao Asc diam:  2.70 cm MITRAL VALVE MV Area (PHT): 3.91 cm    SHUNTS MV Decel Time: 194 msec    Systemic VTI:  0.16 m MV E velocity: 85.00 cm/s  Systemic Diam: 2.00 cm MV A velocity: 96.80 cm/s MV E/A ratio:  0.88 Candee Furbish MD Electronically signed by Candee Furbish MD Signature Date/Time: 12/03/2020/4:32:00 PM    Final     ASSESSMENT:  1.  Erythrocytosis: - Patient seen at the request of Dr. Dennard Schaumann for erythrocytosis. - CBC on 10/27/2020 with hemoglobin 18.2 and hematocrit 53. - No aquagenic pruritus or vasomotor symptoms. - Had COVID about 3 weeks ago and has some cough from it. - He also had kidney stone bothering him for the last 3 to 4 weeks. - He had donated blood x2 but not recently. - Only medication includes metoprolol as needed for PVCs.  Not on testosterone supplements.  2.  Social/family history: -  He lives with his wife in [place].  However he visits his parents in [place] every week and has a house here.  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  Non-smoker.  No history of alcohol use. - Father has dementia.  Mother had kidney transplant.  Paternal grandfather had lung cancer.   PLAN:  1.  Erythrocytosis: - He does not have any aquagenic pruritus or vasomotor symptoms. - This is likely a one-time abnormality. - We will check his CBC and erythropoietin level. - If the CBC shows continued elevated hemoglobin and hematocrit, will consider checking for JAK2 V617F and reflex mutation testing. - We will do phone follow-up after this visit.     Derek Jack, MD 12/04/20 2:31 PM

## 2020-12-11 ENCOUNTER — Other Ambulatory Visit: Payer: Self-pay | Admitting: Family Medicine

## 2020-12-14 ENCOUNTER — Encounter (HOSPITAL_COMMUNITY): Payer: Self-pay | Admitting: Hematology

## 2020-12-14 ENCOUNTER — Inpatient Hospital Stay (HOSPITAL_COMMUNITY): Payer: Federal, State, Local not specified - PPO | Attending: Hematology and Oncology | Admitting: Hematology

## 2020-12-14 ENCOUNTER — Other Ambulatory Visit: Payer: Self-pay

## 2020-12-14 VITALS — Wt 168.0 lb

## 2020-12-14 DIAGNOSIS — D751 Secondary polycythemia: Secondary | ICD-10-CM

## 2020-12-14 NOTE — Progress Notes (Signed)
Virtual Visit via Telephone Note  I connected with Phillip Benson on 12/14/20 at  4:00 PM EDT by telephone and verified that I am speaking with the correct person using two identifiers.  Location: Patient: At home Provider: In the office   I discussed the limitations, risks, security and privacy concerns of performing an evaluation and management service by telephone and the availability of in person appointments. I also discussed with the patient that there may be a patient responsible charge related to this service. The patient expressed understanding and agreed to proceed.   History of Present Illness: He was evaluated in our clinic on 12/04/2020 for elevated hemoglobin, hematocrit and RBC count.  He reported having kidney stone at the time of labs.  He is a non-smoker.   Observations/Objective: He reportedly passed kidney stone last week.  He is hydrating well.  Denies any aquagenic pruritus or vasomotor symptoms.  Assessment and Plan:  1.  Resolved erythrocytosis: - We have reviewed CBC from 12/04/2020 which showed normal hemoglobin and hematocrit.  RBC count also normalized. - It is possible that his labs on 10/27/2020, dehydration caused high hemoglobin and hematocrit. - RTC 6 months with labs on same day. - If CBC remains normal at that time, we can discharge him from the clinic to follow-up with Dr. Tanya Nones.   Follow Up Instructions: RTC 6 months with labs on same day.   I discussed the assessment and treatment plan with the patient. The patient was provided an opportunity to ask questions and all were answered. The patient agreed with the plan and demonstrated an understanding of the instructions.   The patient was advised to call back or seek an in-person evaluation if the symptoms worsen or if the condition fails to improve as anticipated.  I provided 11 minutes of non-face-to-face time during this encounter.   Doreatha Massed, MD

## 2021-03-11 DIAGNOSIS — S61255A Open bite of left ring finger without damage to nail, initial encounter: Secondary | ICD-10-CM | POA: Diagnosis not present

## 2021-03-11 DIAGNOSIS — W5581XA Bitten by other mammals, initial encounter: Secondary | ICD-10-CM | POA: Diagnosis not present

## 2021-03-11 DIAGNOSIS — Z203 Contact with and (suspected) exposure to rabies: Secondary | ICD-10-CM | POA: Diagnosis not present

## 2021-03-15 DIAGNOSIS — W5581XA Bitten by other mammals, initial encounter: Secondary | ICD-10-CM | POA: Diagnosis not present

## 2021-03-15 DIAGNOSIS — S61259A Open bite of unspecified finger without damage to nail, initial encounter: Secondary | ICD-10-CM | POA: Diagnosis not present

## 2021-03-15 DIAGNOSIS — Z23 Encounter for immunization: Secondary | ICD-10-CM | POA: Diagnosis not present

## 2021-03-19 DIAGNOSIS — W5581XA Bitten by other mammals, initial encounter: Secondary | ICD-10-CM | POA: Diagnosis not present

## 2021-03-19 DIAGNOSIS — S61259A Open bite of unspecified finger without damage to nail, initial encounter: Secondary | ICD-10-CM | POA: Diagnosis not present

## 2021-03-19 DIAGNOSIS — Z23 Encounter for immunization: Secondary | ICD-10-CM | POA: Diagnosis not present

## 2021-03-26 DIAGNOSIS — Z23 Encounter for immunization: Secondary | ICD-10-CM | POA: Diagnosis not present

## 2021-03-26 DIAGNOSIS — W5581XA Bitten by other mammals, initial encounter: Secondary | ICD-10-CM | POA: Diagnosis not present

## 2021-03-26 DIAGNOSIS — S61259A Open bite of unspecified finger without damage to nail, initial encounter: Secondary | ICD-10-CM | POA: Diagnosis not present

## 2021-06-28 ENCOUNTER — Inpatient Hospital Stay (HOSPITAL_COMMUNITY): Payer: Federal, State, Local not specified - PPO | Admitting: Hematology

## 2021-06-28 ENCOUNTER — Inpatient Hospital Stay (HOSPITAL_COMMUNITY): Payer: Federal, State, Local not specified - PPO | Attending: Hematology

## 2021-06-28 ENCOUNTER — Encounter (HOSPITAL_COMMUNITY): Payer: Self-pay | Admitting: Hematology

## 2021-06-28 VITALS — BP 109/79 | HR 67 | Temp 97.0°F | Resp 18 | Ht 71.0 in | Wt 175.7 lb

## 2021-06-28 DIAGNOSIS — Z8719 Personal history of other diseases of the digestive system: Secondary | ICD-10-CM | POA: Insufficient documentation

## 2021-06-28 DIAGNOSIS — Z87442 Personal history of urinary calculi: Secondary | ICD-10-CM | POA: Diagnosis not present

## 2021-06-28 DIAGNOSIS — D751 Secondary polycythemia: Secondary | ICD-10-CM | POA: Diagnosis not present

## 2021-06-28 LAB — CBC WITH DIFFERENTIAL/PLATELET
Abs Immature Granulocytes: 0.01 10*3/uL (ref 0.00–0.07)
Basophils Absolute: 0 10*3/uL (ref 0.0–0.1)
Basophils Relative: 0 %
Eosinophils Absolute: 0.1 10*3/uL (ref 0.0–0.5)
Eosinophils Relative: 2 %
HCT: 46.3 % (ref 39.0–52.0)
Hemoglobin: 15.5 g/dL (ref 13.0–17.0)
Immature Granulocytes: 0 %
Lymphocytes Relative: 24 %
Lymphs Abs: 1.5 10*3/uL (ref 0.7–4.0)
MCH: 31.4 pg (ref 26.0–34.0)
MCHC: 33.5 g/dL (ref 30.0–36.0)
MCV: 93.9 fL (ref 80.0–100.0)
Monocytes Absolute: 0.4 10*3/uL (ref 0.1–1.0)
Monocytes Relative: 7 %
Neutro Abs: 4.2 10*3/uL (ref 1.7–7.7)
Neutrophils Relative %: 67 %
Platelets: 200 10*3/uL (ref 150–400)
RBC: 4.93 MIL/uL (ref 4.22–5.81)
RDW: 13.3 % (ref 11.5–15.5)
WBC: 6.2 10*3/uL (ref 4.0–10.5)
nRBC: 0 % (ref 0.0–0.2)

## 2021-06-28 LAB — LACTATE DEHYDROGENASE: LDH: 110 U/L (ref 98–192)

## 2021-06-28 NOTE — Progress Notes (Signed)
? ?[facility] ?[address] ?[place], [place] [address] ? ? ?CLINIC:  ?Medical Oncology/Hematology ? ?PCP:  ?Donita BrooksPickard, Warren T, MD ?[address] / [place] [id number]  ?[phone] ? ?REASON FOR VISIT:  ?Follow-up for erythrocytosis ? ?PRIOR THERAPY: none ? ?CURRENT THERAPY: surveillance ? ?INTERVAL HISTORY:  ?Phillip Benson, a 61 y.o. 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 Phillip MaplesGlenn was last seen on 12/14/2020 via telephone. ? ?Today he reports feeling well. He reports bat bite on his left hand finger on 1/9. He denies itching after shower, headaches, blurry vision, and fingers changing colors.  ? ?REVIEW OF SYSTEMS:  ?Review of Systems  ?Constitutional:  Positive for fatigue. Negative for appetite change.  ?Eyes:  Negative for eye problems.  ?Cardiovascular:  Positive for chest pain.  ?Skin:  Negative for itching.  ?Neurological:  Negative for headaches.  ?Psychiatric/Behavioral:  The patient is nervous/anxious.   ?All other systems reviewed and are negative. ? ?PAST MEDICAL/SURGICAL HISTORY:  ?Past Medical History:  ?Diagnosis Date  ? Gastritis and duodenitis   ? egd-2017  ? Personal history of kidney stones 2008  ? x 1  ? ?Past Surgical History:  ?Procedure Laterality Date  ? colonscopy  may 2017  ? benign polyps removed  ? ESOPHAGOGASTRODUODENOSCOPY ENDOSCOPY  may 2017  ? KIDNEY STONE SURGERY  2008  ? KNEE ARTHROSCOPY WITH LATERAL MENISECTOMY Right 07/22/2015  ? Procedure: RIGHT KNEE ARTHROSCOPY WITH MEDIAL MENISCAL DEBRIDEMENT ;  Surgeon: Ollen GrossFrank Aluisio, MD;  Location: [facility];  Service: Orthopedics;  Laterality: Right;  ? ? ?SOCIAL HISTORY:  ?Social History  ? ?Socioeconomic History  ? Marital status: Married  ?  Spouse name: Not on file  ? Number of children: Not on file  ? Years of education: Not on file  ? Highest education level: Not on file  ?Occupational History  ? Not on file  ?Tobacco Use  ? Smoking status: Never  ? Smokeless tobacco: Never  ?Substance and  Sexual Activity  ? Alcohol use: No  ? Drug use: No  ? Sexual activity: Not on file  ?Other Topics Concern  ? Not on file  ?Social History Narrative  ? Not on file  ? ?Social Determinants of Health  ? ?Financial Resource Strain: Not on file  ?Food Insecurity: Not on file  ?Transportation Needs: Not on file  ?Physical Activity: Not on file  ?Stress: Not on file  ?Social Connections: Not on file  ?Intimate Partner Violence: Not on file  ? ? ?FAMILY HISTORY:  ?Family History  ?Problem Relation Age of Onset  ? Kidney disease Mother   ? Heart disease Maternal Grandmother   ? Cancer Paternal Grandfather   ? Kidney disease Paternal Grandfather   ? ? ?CURRENT MEDICATIONS:  ?Current Outpatient Medications  ?Medication Sig Dispense Refill  ? metoprolol tartrate (LOPRESSOR) 25 MG tablet TAKE 1 TABLET BY MOUTH TWICE A DAY AS NEEDED (Patient not taking: Reported on 06/28/2021) 180 tablet 1  ? ?No current facility-administered medications for this visit.  ? ? ?ALLERGIES:  ?No Known Allergies ? ?PHYSICAL EXAM:  ?Performance status (ECOG): 0 - Asymptomatic ? ?Vitals:  ? 06/28/21 1456  ?BP: 109/79  ?Pulse: 67  ?Resp: 18  ?Temp: (!) 97 ?F (36.1 ?C)  ?SpO2: 100%  ? ?Wt Readings from Last 3 Encounters:  ?06/28/21 175 lb 11.2 oz (79.7 kg)  ?12/14/20 168 lb (76.2 kg)  ?12/04/20 170 lb 14.4 oz (77.5 kg)  ? ?Physical Exam ?Vitals  reviewed.  ?Constitutional:   ?   Appearance: Normal appearance.  ?Cardiovascular:  ?   Rate and Rhythm: Normal rate and regular rhythm.  ?   Pulses: Normal pulses.  ?   Heart sounds: Normal heart sounds.  ?Pulmonary:  ?   Effort: Pulmonary effort is normal.  ?   Breath sounds: Normal breath sounds.  ?Neurological:  ?   General: No focal deficit present.  ?   Mental Status: He is alert and oriented to person, place, and time.  ?Psychiatric:     ?   Mood and Affect: Mood normal.     ?   Behavior: Behavior normal.  ? ? ?LABORATORY DATA:  ?I have reviewed the labs as listed.  ? ?  Latest Ref Rng & Units 06/28/2021  ?   1:49 PM 12/04/2020  ? 10:24 AM 10/27/2020  ?  9:13 AM  ?CBC  ?WBC 4.0 - 10.5 K/uL 6.2   6.7   6.0    ?Hemoglobin 13.0 - 17.0 g/dL 50.3   54.6   56.8    ?Hematocrit 39.0 - 52.0 % 46.3   50.8   53.3    ?Platelets 150 - 400 K/uL 200   264   245    ? ? ?  Latest Ref Rng & Units 10/27/2020  ?  9:13 AM 10/22/2019  ?  8:58 AM 04/30/2015  ? 11:58 AM  ?CMP  ?Glucose 65 - 99 mg/dL 87   84   83    ?BUN 7 - 25 mg/dL 14   13   13     ?Creatinine 0.70 - 1.35 mg/dL   1.27   5.17    ?Sodium 135 - 146 mmol/L 141   140   140    ?Potassium 3.5 - 5.3 mmol/L 5.1   4.8   4.3    ?Chloride 98 - 110 mmol/L 104   104   103    ?CO2 20 - 32 mmol/L 28   29   29     ?Calcium 8.6 - 10.3 mg/dL 9.4   9.4   9.2    ?Total Protein 6.1 - 8.1 g/dL 6.6   6.6   6.7    ?Total Bilirubin 0.2 - 1.2 mg/dL 0.8   0.6   0.8    ?Alkaline Phos 40 - 115 U/L   47    ?AST 10 - 35 U/L 18   19   21     ?ALT 9 - 46 U/L 16   17   17     ? ?   ?Component Value Date/Time  ? RBC 4.93 06/28/2021 1349  ? MCV 93.9 06/28/2021 1349  ? MCH 31.4 06/28/2021 1349  ? MCHC 33.5 06/28/2021 1349  ? RDW 13.3 06/28/2021 1349  ? LYMPHSABS 1.5 06/28/2021 1349  ? MONOABS 0.4 06/28/2021 1349  ? EOSABS 0.1 06/28/2021 1349  ? BASOSABS 0.0 06/28/2021 1349  ? ? ?DIAGNOSTIC IMAGING:  ?I have independently reviewed the scans and discussed with the patient. ?No results found.  ? ?ASSESSMENT:  ?1.  Erythrocytosis: ?- Patient seen at the request of Dr. 06/30/2021 for erythrocytosis. ?- CBC on 10/27/2020 with hemoglobin 18.2 and hematocrit 53. ?- No aquagenic pruritus or vasomotor symptoms. ?- Had COVID about 3 weeks ago and has some cough from it. ?- He also had kidney stone bothering him for the last 3 to 4 weeks. ?- He had donated blood x2 but not recently. ?- Only medication includes metoprolol as needed for PVCs.  Not on testosterone supplements. ? ?2.  Social/family history: ?- He lives with his [place] in [place].  However he visits his parents in [place] every week and has a house here.   He worked as Production designer, theatre/television/film in [employer].  Non-smoker.  No history of alcohol use. ?- Father has dementia.  Mother had kidney transplant.  Paternal grandfather had lung cancer. ? ? ?PLAN:  ?1.  Resolved erythrocytosis: ?- He does not have any aquagenic pruritus or vasomotor symptoms. ?- He reportedly had to have rabies vaccination and immunoglobulin says he came in contact with the bat at home. ?- He denies any headaches or vision changes. ?- CBC from today shows hemoglobin is 15.5, hematocrit is 46 and normal white count and platelet count. ?- As his erythrocytosis has resolved, he no longer needs follow-up with me.  We will be glad to see him on an as-needed basis. ? ?Orders placed this encounter:  ?No orders of the defined types were placed in this encounter. ? ? ? ?Doreatha Massed, MD ?[facility] ?[phone] ? ? ?I, Alda Ponder, am acting as a scribe for Dr. Doreatha Massed. ? ?I, Doreatha Massed MD, have reviewed the above documentation for accuracy and completeness, and I agree with the above. ?  ? ? ?

## 2021-06-28 NOTE — Patient Instructions (Signed)
[facility] at [facility] ?Discharge Instructions ? ?You were seen and examined today by Dr. Ellin Saba. ? ?Dr. Ellin Saba discussed your most recent lab work and everything looks good. ? ?Follow-up as needed. ? ? ?Thank you for choosing [facility] at [facility] to provide your oncology and hematology care.  To afford each patient quality time with our provider, please arrive at least 15 minutes before your scheduled appointment time.  ? ?If you have a lab appointment with the Cancer Center please come in thru the Main Entrance and check in at the main information desk. ? ?You need to re-schedule your appointment should you arrive 10 or more minutes late.  We strive to give you quality time with our providers, and arriving late affects you and other patients whose appointments are after yours.  Also, if you no show three or more times for appointments you may be dismissed from the clinic at the providers discretion.     ?Again, thank you for choosing [facility].  Our hope is that these requests will decrease the amount of time that you wait before being seen by our physicians.       ?_____________________________________________________________ ? ?Should you have questions after your visit to [facility], please contact our office at [phone] and follow the prompts.  Our office hours are 8:00 a.m. and 4:30 p.m. Monday - Friday.  Please note that voicemails left after 4:00 p.m. may not be returned until the following business day.  We are closed weekends and major holidays.  You do have access to a nurse 24-7, just call the main number to the clinic [phone] and do not press any options, hold on the line and a nurse will answer the phone.   ? ?For prescription refill requests, have your pharmacy contact our office and allow 72 hours.   ? ?Due to Covid, you will need to wear a mask upon entering the hospital. If you do not have a mask,  a mask will be given to you at the Main Entrance upon arrival. For doctor visits, patients may have 1 support person age 17 or older with them. For treatment visits, patients can not have anyone with them due to social distancing guidelines and our immunocompromised population.  ? ?  ?

## 2021-08-17 ENCOUNTER — Ambulatory Visit
Admission: EM | Admit: 2021-08-17 | Discharge: 2021-08-17 | Disposition: A | Discharge status: Home / Self Care | Payer: Federal, State, Local not specified - PPO | Attending: Nurse Practitioner | Admitting: Nurse Practitioner

## 2021-08-17 ENCOUNTER — Encounter: Payer: Self-pay | Admitting: Emergency Medicine

## 2021-08-17 ENCOUNTER — Ambulatory Visit (INDEPENDENT_AMBULATORY_CARE_PROVIDER_SITE_OTHER): Payer: Federal, State, Local not specified - PPO

## 2021-08-17 ENCOUNTER — Other Ambulatory Visit: Payer: Self-pay

## 2021-08-17 DIAGNOSIS — S63501A Unspecified sprain of right wrist, initial encounter: Secondary | ICD-10-CM

## 2021-08-17 DIAGNOSIS — M7989 Other specified soft tissue disorders: Secondary | ICD-10-CM | POA: Diagnosis not present

## 2021-08-17 DIAGNOSIS — M25531 Pain in right wrist: Secondary | ICD-10-CM | POA: Diagnosis not present

## 2021-08-17 MED ORDER — IBUPROFEN 800 MG PO TABS: 800.0000 mg | ORAL_TABLET | Freq: Three times a day (TID) | ORAL | 0 refills | Status: DC

## 2021-08-17 NOTE — Discharge Instructions (Addendum)
Rays were negative for fracture or dislocation. Take medication as prescribed. Continue application of ice to the right hand and wrist.  Apply for 20 minutes, remove for 1 hour, then repeat. RICE therapy, rest, ice, compression and elevation.  You can continue to wear your wrist brace to help with compression.  Elevate the right arm on pillows is much as possible. If your symptoms do not improve within the next 2 weeks, recommend following up with orthopedics.  She can follow-up with [facility] [place] at [phone] or [facility] in [place] at [phone]. Follow-up as needed.

## 2021-08-17 NOTE — ED Provider Notes (Signed)
[facility]    CSN: [id number] Arrival date & time: 08/17/21  0809      History   Chief Complaint Chief Complaint  Patient presents with   Hand Pain    HPI Phillip Benson is a 61 y.o. male.   The history is provided by the patient.   Patient presents with right hand and wrist pain that happened when he fell yesterday.  Patient reports he was stepping back from a wall sinus and lost his footing.  He states that he fell onto the right wrist.  Today he presents with swelling to the right hand, decreased range of motion, and radiation of pain from the hand into the wrist.  He denies numbness, tingling, or previous injuries.  He has been using Epsom salt soaks and over-the-counter pain medication for his symptoms.  He also states that he slept in a wrist brace last evening.  Past Medical History:  Diagnosis Date   Gastritis and duodenitis    egd-2017   Personal history of kidney stones 2008   x 1    Patient Active Problem List   Diagnosis Date Noted   Erythrocytosis 12/04/2020   Acute medial meniscal tear 07/21/2015   Personal history of kidney stones     Past Surgical History:  Procedure Laterality Date   colonscopy  may 2017   benign polyps removed   ESOPHAGOGASTRODUODENOSCOPY ENDOSCOPY  may 2017   KIDNEY STONE SURGERY  2008   KNEE ARTHROSCOPY WITH LATERAL MENISECTOMY Right 07/22/2015   Procedure: RIGHT KNEE ARTHROSCOPY WITH MEDIAL MENISCAL DEBRIDEMENT ;  Surgeon: Ollen GrossFrank Aluisio, MD;  Location: [facility];  Service: Orthopedics;  Laterality: Right;       Home Medications    Prior to Admission medications   Medication Sig Start Date End Date Taking? Authorizing Provider  ibuprofen (ADVIL) 800 MG tablet Take 1 tablet (800 mg total) by mouth 3 (three) times daily. 08/17/21  Yes Juanell Saffo-Warren, Sadie Haberhristie J, NP  metoprolol tartrate (LOPRESSOR) 25 MG tablet TAKE 1 TABLET BY MOUTH TWICE A DAY AS NEEDED Patient not taking: Reported on 06/28/2021 12/11/20   Donita BrooksPickard,  Warren T, MD    Family History Family History  Problem Relation Age of Onset   Kidney disease Mother    Heart disease Maternal Grandmother    Cancer Paternal Grandfather    Kidney disease Paternal Grandfather     Social History Social History   Tobacco Use   Smoking status: Never   Smokeless tobacco: Never  Substance Use Topics   Alcohol use: No   Drug use: No     Allergies   Patient has no known allergies.   Review of Systems Review of Systems Per HPI  Physical Exam Triage Vital Signs ED Triage Vitals  Enc Vitals Group     BP 08/17/21 0824 114/74     Pulse Rate 08/17/21 0824 68     Resp 08/17/21 0824 18     Temp 08/17/21 0824 (!) 97.4 F (36.3 C)     Temp Source 08/17/21 0824 Oral     SpO2 08/17/21 0824 97 %     Weight 08/17/21 0825 165 lb (74.8 kg)     Height 08/17/21 0825 5\' 11"  (1.803 m)     Head Circumference --      Peak Flow --      Pain Score 08/17/21 0825 4     Pain Loc --      Pain Edu? --      Excl.  in GC? --    No data found.  Updated Vital Signs BP 114/74 (BP Location: Right Arm)   Pulse 68   Temp (!) 97.4 F (36.3 C) (Oral)   Resp 18   Ht 5\' 11"  (1.803 m)   Wt 165 lb (74.8 kg)   SpO2 97%   BMI 23.01 kg/m   Visual Acuity Right Eye Distance:   Left Eye Distance:   Bilateral Distance:    Right Eye Near:   Left Eye Near:    Bilateral Near:     Physical Exam Vitals and nursing note reviewed.  Constitutional:      General: He is not in acute distress.    Appearance: Normal appearance.  Eyes:     Pupils: Pupils are equal, round, and reactive to light.  Cardiovascular:     Rate and Rhythm: Normal rate.  Pulmonary:     Effort: Pulmonary effort is normal.  Musculoskeletal:     Right forearm: Normal.     Right wrist: Swelling, tenderness and snuff box tenderness present. No deformity. Decreased range of motion.     Right hand: Swelling present. No deformity or tenderness. Normal range of motion.     Cervical back: Normal  range of motion.  Skin:    General: Skin is warm and dry.  Neurological:     General: No focal deficit present.     Mental Status: He is alert and oriented to person, place, and time.  Psychiatric:        Mood and Affect: Mood normal.        Behavior: Behavior normal.      UC Treatments / Results  Labs (all labs ordered are listed, but only abnormal results are displayed) Labs Reviewed - No data to display  EKG   Radiology DG Wrist Complete Right  Result Date: 08/17/2021 CLINICAL DATA:  Fall, pain EXAM: RIGHT WRIST - COMPLETE 3+ VIEW COMPARISON:  None Available. FINDINGS: There is no evidence of fracture or dislocation. There is no evidence of arthropathy or other focal bone abnormality. Soft tissues are unremarkable. IMPRESSION: No fracture or dislocation of the right wrist. The carpus is normally aligned. Electronically Signed   By: 08/19/2021 M.D.   On: 08/17/2021 08:47    Procedures Procedures (including critical care time)  Medications Ordered in UC Medications - No data to display  Initial Impression / Assessment and Plan / UC Course  I have reviewed the triage vital signs and the nursing notes.  Pertinent labs & imaging results that were available during my care of the patient were reviewed by me and considered in my medical decision making (see chart for details).  Patient presents with pain in the right forearm that is Stensen to the right hand.  Symptoms started yesterday after he fell.  On exam, he has decreased range of motion and tenderness to the palmar aspect of the wrist.  There is no obvious deformity or ecchymosis noted.  His x-rays are negative for fracture or dislocation.  Supportive care recommendations were provided to the patient to include RICE therapy, and ibuprofen.  Patient was advised that if symptoms do not improve within the next 2 weeks, recommend following up with the orthopedics.  Patient was given information for [facility] [place] and  [facility]. Final Clinical Impressions(s) / UC Diagnoses   Final diagnoses:  Right wrist sprain, initial encounter  Swelling of right hand     Discharge Instructions      Rays were negative  for fracture or dislocation. Take medication as prescribed. Continue application of ice to the right hand and wrist.  Apply for 20 minutes, remove for 1 hour, then repeat. RICE therapy, rest, ice, compression and elevation.  You can continue to wear your wrist brace to help with compression.  Elevate the right arm on pillows is much as possible. If your symptoms do not improve within the next 2 weeks, recommend following up with orthopedics.  She can follow-up with [facility] [place] at [phone] or [facility] in [place] at [phone]. Follow-up as needed.     ED Prescriptions     Medication Sig Dispense Auth. Provider   ibuprofen (ADVIL) 800 MG tablet Take 1 tablet (800 mg total) by mouth 3 (three) times daily. 30 tablet Maricia Scotti-Warren, Sadie Haber, NP      PDMP not reviewed this encounter.   Abran Cantor, NP 08/17/21 [phone]

## 2021-08-17 NOTE — ED Triage Notes (Signed)
Pt reports stepped back from a wasp nest and reports lost footing. Pt reports fell on right side and reports "wrist took the majority of my fall."   Denies loc or being on blood thinners. Right hand swelling, limited ROM/grip in right hand and reports pain is bottom of right hand into right wrist. No obvious deformity noted.radial pulse strong. Cap refill wnl.  Pt reports used epsom salt and otc pain relief medication with no change in symptoms.

## 2021-10-29 DIAGNOSIS — S81852A Open bite, left lower leg, initial encounter: Secondary | ICD-10-CM | POA: Diagnosis not present

## 2021-10-29 DIAGNOSIS — W540XXA Bitten by dog, initial encounter: Secondary | ICD-10-CM | POA: Diagnosis not present

## 2021-10-29 DIAGNOSIS — S61252A Open bite of right middle finger without damage to nail, initial encounter: Secondary | ICD-10-CM | POA: Diagnosis not present

## 2021-10-29 DIAGNOSIS — S81851A Open bite, right lower leg, initial encounter: Secondary | ICD-10-CM | POA: Diagnosis not present

## 2021-10-29 DIAGNOSIS — Z23 Encounter for immunization: Secondary | ICD-10-CM | POA: Diagnosis not present

## 2021-11-12 ENCOUNTER — Ambulatory Visit: Payer: Federal, State, Local not specified - PPO | Admitting: Family Medicine

## 2021-11-12 VITALS — BP 132/86 | HR 88 | Temp 98.5°F | Ht 71.0 in | Wt 168.0 lb

## 2021-11-12 DIAGNOSIS — M25531 Pain in right wrist: Secondary | ICD-10-CM

## 2021-11-12 DIAGNOSIS — W540XXD Bitten by dog, subsequent encounter: Secondary | ICD-10-CM | POA: Diagnosis not present

## 2021-11-12 DIAGNOSIS — S91051D Open bite, right ankle, subsequent encounter: Secondary | ICD-10-CM | POA: Diagnosis not present

## 2021-11-12 NOTE — Progress Notes (Signed)
Subjective:    Patient ID: Phillip Benson, male    DOB: 01/05/1961, 61 y.o.   MRN: [id number]  Pt was bit by 2 dogs with 31 puncture wounds. Pt has 2 wounds, one to each leg that he would like checked. Pt states they are still pain. This occurred 14 days ago.  He was started on Augmentin 10 days ago is finished the antibiotics.  He has been off antibiotics for 4 days now.  The wound on his left ankle appears to be healing well.  There is no erythema.  There is no open wound.  There is some bruising and I believe a small hematoma roughly 1 cm in diameter below the puncture site.  I am unable to express any pus.  On the medial surface of, there is a seroma forming.  This can be seen inferior to the [place].  When I pressed on that area with my finger, it ruptured and the seroma spread into the surrounding tissue.  There was no palpable fluid accumulation after that.  Nothing was expressed from the puncture wound.  There is no warmth or redness or erythema to suggest an abscess.  The patient denies any pain.  Second concern is an June, the patient fell backward and sprained his right wrist.  He has pain over the distal ulna with extreme dorsiflexion of the wrist.  It is hard for him to push himself off the floor due to pain in that area.  Initial x-rays were negative for any fracture.  I believe the patient likely suffered a severe sprain of the wrist and likely tore several ligaments.  He does report some crepitus with range of motion   Past Medical History:  Diagnosis Date   Gastritis and duodenitis    egd-2017   Personal history of kidney stones 2008   x 1   Past Surgical History:  Procedure Laterality Date   colonscopy  may 2017   benign polyps removed   ESOPHAGOGASTRODUODENOSCOPY ENDOSCOPY  may 2017   KIDNEY STONE SURGERY  2008   KNEE ARTHROSCOPY WITH LATERAL MENISECTOMY Right 07/22/2015   Procedure: RIGHT KNEE ARTHROSCOPY WITH MEDIAL MENISCAL DEBRIDEMENT ;  Surgeon: Ollen Gross, MD;   Location: [facility];  Service: Orthopedics;  Laterality: Right;   Current Outpatient Medications on File Prior to Visit  Medication Sig Dispense Refill   amoxicillin-clavulanate (AUGMENTIN) 875-125 MG tablet Take 1 tablet by mouth 2 (two) times daily.     ibuprofen (ADVIL) 800 MG tablet Take 1 tablet (800 mg total) by mouth 3 (three) times daily. 30 tablet 0   metoprolol tartrate (LOPRESSOR) 25 MG tablet TAKE 1 TABLET BY MOUTH TWICE A DAY AS NEEDED (Patient not taking: Reported on 06/28/2021) 180 tablet 1   No current facility-administered medications on file prior to visit.   No Known Allergies Social History   Socioeconomic History   Marital status: Married    Spouse name: Not on file   Number of children: Not on file   Years of education: Not on file   Highest education level: Not on file  Occupational History   Not on file  Tobacco Use   Smoking status: Never   Smokeless tobacco: Never  Substance and Sexual Activity   Alcohol use: No   Drug use: No   Sexual activity: Not on file  Other Topics Concern   Not on file  Social History Narrative   Not on file   Social Determinants of Health   Financial  Resource Strain: Not on file  Food Insecurity: Not on file  Transportation Needs: Not on file  Physical Activity: Not on file  Stress: Not on file  Social Connections: Not on file  Intimate Partner Violence: Not on file      Review of Systems  All other systems reviewed and are negative.      Objective:   Physical Exam Vitals reviewed.  Constitutional:      General: He is not in acute distress.    Appearance: He is well-developed. He is not diaphoretic.  HENT:     Right Ear: External ear normal.     Left Ear: External ear normal.     Nose: Nose normal.     Mouth/Throat:     Pharynx: No oropharyngeal exudate.  Eyes:     Conjunctiva/sclera: Conjunctivae normal.  Cardiovascular:     Rate and Rhythm: Normal rate and regular rhythm.     Heart sounds: Normal heart  sounds. No murmur heard. Pulmonary:     Effort: Pulmonary effort is normal. No respiratory distress.     Breath sounds: Normal breath sounds. No wheezing or rales.  Abdominal:     General: Bowel sounds are normal.     Palpations: Abdomen is soft.  Musculoskeletal:     Right wrist: Tenderness, bony tenderness and crepitus present. No swelling, deformity, effusion or snuff box tenderness. Normal range of motion.     Cervical back: Neck supple.     Right ankle: Normal range of motion.     Left ankle: Normal range of motion.       Feet:  Lymphadenopathy:     Cervical: No cervical adenopathy.           Assessment & Plan:  Right wrist pain - Plan: DG Wrist Complete Right  Dog bite of right ankle, subsequent encounter I believe the patient suffered a sprain to his right wrist where he tore several ligaments.  I do not suspect a fracture.  I will repeat an x-ray to rule out any kind of nonunion or nonhealing fracture.  Recommended gradual range of motion exercises to improve range of motion after the initial injury.  Patient has a seroma that I ruptured with palpation on his medial right ankle.  There is no evidence of any abscess or infection.  Wound seem to be healing properly.  Monitor for any evidence of a secondary infection but I believe the patient should be fine at this point.

## 2022-01-13 DIAGNOSIS — D2262 Melanocytic nevi of left upper limb, including shoulder: Secondary | ICD-10-CM | POA: Diagnosis not present

## 2022-01-13 DIAGNOSIS — Z1283 Encounter for screening for malignant neoplasm of skin: Secondary | ICD-10-CM | POA: Diagnosis not present

## 2022-01-13 DIAGNOSIS — D225 Melanocytic nevi of trunk: Secondary | ICD-10-CM | POA: Diagnosis not present

## 2022-01-13 DIAGNOSIS — D485 Neoplasm of uncertain behavior of skin: Secondary | ICD-10-CM | POA: Diagnosis not present

## 2022-01-18 ENCOUNTER — Encounter: Payer: Self-pay | Admitting: Family Medicine

## 2022-01-18 ENCOUNTER — Ambulatory Visit (INDEPENDENT_AMBULATORY_CARE_PROVIDER_SITE_OTHER): Payer: Federal, State, Local not specified - PPO | Admitting: Family Medicine

## 2022-01-18 VITALS — BP 120/76 | HR 66 | Ht 71.0 in | Wt 174.4 lb

## 2022-01-18 DIAGNOSIS — Z125 Encounter for screening for malignant neoplasm of prostate: Secondary | ICD-10-CM

## 2022-01-18 DIAGNOSIS — Z0000 Encounter for general adult medical examination without abnormal findings: Secondary | ICD-10-CM

## 2022-01-18 DIAGNOSIS — Z23 Encounter for immunization: Secondary | ICD-10-CM | POA: Diagnosis not present

## 2022-01-18 DIAGNOSIS — E7800 Pure hypercholesterolemia, unspecified: Secondary | ICD-10-CM | POA: Diagnosis not present

## 2022-01-18 LAB — CBC WITH DIFFERENTIAL/PLATELET
Absolute Monocytes: 551 cells/uL (ref 200–950)
Basophils Absolute: 7 cells/uL (ref 0–200)
Basophils Relative: 0.1 %
Eosinophils Absolute: 129 cells/uL (ref 15–500)
Eosinophils Relative: 1.9 %
HCT: 49.5 % (ref 38.5–50.0)
Hemoglobin: 17.2 g/dL — ABNORMAL HIGH (ref 13.2–17.1)
Lymphs Abs: 1700 cells/uL (ref 850–3900)
MCH: 31.2 pg (ref 27.0–33.0)
MCHC: 34.7 g/dL (ref 32.0–36.0)
MCV: 89.7 fL (ref 80.0–100.0)
MPV: 10.6 fL (ref 7.5–12.5)
Monocytes Relative: 8.1 %
Neutro Abs: 4413 cells/uL (ref 1500–7800)
Neutrophils Relative %: 64.9 %
Platelets: 223 10*3/uL (ref 140–400)
RBC: 5.52 10*6/uL (ref 4.20–5.80)
RDW: 12.6 % (ref 11.0–15.0)
Total Lymphocyte: 25 %
WBC: 6.8 10*3/uL (ref 3.8–10.8)

## 2022-01-18 LAB — COMPLETE METABOLIC PANEL WITH GFR
AG Ratio: 1.8 (calc) (ref 1.0–2.5)
ALT: 21 U/L (ref 9–46)
AST: 22 U/L (ref 10–35)
Albumin: 4.2 g/dL (ref 3.6–5.1)
Alkaline phosphatase (APISO): 58 U/L (ref 35–144)
BUN: 12 mg/dL (ref 7–25)
CO2: 23 mmol/L (ref 20–32)
Calcium: 8.9 mg/dL (ref 8.6–10.3)
Chloride: 106 mmol/L (ref 98–110)
Creat: 0.78 mg/dL (ref 0.70–1.35)
Globulin: 2.4 g/dL (calc) (ref 1.9–3.7)
Glucose, Bld: 83 mg/dL (ref 65–99)
Potassium: 4.4 mmol/L (ref 3.5–5.3)
Sodium: 137 mmol/L (ref 135–146)
Total Bilirubin: 0.7 mg/dL (ref 0.2–1.2)
Total Protein: 6.6 g/dL (ref 6.1–8.1)
eGFR: 101 mL/min/{1.73_m2} (ref >=60)

## 2022-01-18 LAB — LIPID PANEL
Cholesterol: 191 mg/dL (ref <200)
HDL: 56 mg/dL (ref >=40)
LDL Cholesterol (Calc): 110 mg/dL (calc) — ABNORMAL HIGH
Non-HDL Cholesterol (Calc): 135 mg/dL (calc) — ABNORMAL HIGH (ref <130)
Total CHOL/HDL Ratio: 3.4 (calc) (ref <5.0)
Triglycerides: 134 mg/dL (ref <150)

## 2022-01-18 LAB — PSA: PSA: 2.2 ng/mL (ref <=4.00)

## 2022-01-18 NOTE — Addendum Note (Signed)
Addended by: Venia Carbon K on: 01/18/2022 11:52 AM   Modules accepted: Orders

## 2022-01-18 NOTE — Progress Notes (Signed)
Subjective:    Patient ID: Phillip Benson, male    DOB: 1960-03-11, 61 y.o.   MRN: [id number]  Patient is a very pleasant 61 year old Caucasian gentleman here today for complete physical exam.  He is due for a flu shot.  He is due for a COVID shot, shingles vaccine as well as RSV vaccine.  He would like to get the flu shot today.  I did recommend to him the shingles vaccine as well as a COVID booster and he can get these at his pharmacy if he chooses.  He believes that he had a colonoscopy about 10 years ago although he is uncertain.  I need to contact his gastroenterologist and verify the date of his last colonoscopy.  He is due for prostate cancer screening.  Other than that he is doing well.  He had a slight cough after an upper respiratory infection but is slowly getting better. Immunization History  Administered Date(s) Administered   Influenza,inj,Quad PF,6+ Mos 12/15/2014   Influenza-Unspecified 12/06/2014   Moderna Sars-Covid-2 Vaccination 07/04/2019, 08/01/2019   Rabies, IM 03/12/2021, 03/15/2021, 03/19/2021, 03/26/2021   Tdap 04/30/2015    Past Medical History:  Diagnosis Date   Gastritis and duodenitis    egd-2017   Personal history of kidney stones 2008   x 1   Past Surgical History:  Procedure Laterality Date   colonscopy  may 2017   benign polyps removed   ESOPHAGOGASTRODUODENOSCOPY ENDOSCOPY  may 2017   KIDNEY STONE SURGERY  2008   KNEE ARTHROSCOPY WITH LATERAL MENISECTOMY Right 07/22/2015   Procedure: RIGHT KNEE ARTHROSCOPY WITH MEDIAL MENISCAL DEBRIDEMENT ;  Surgeon: Ollen Gross, MD;  Location: [facility];  Service: Orthopedics;  Laterality: Right;   Current Outpatient Medications on File Prior to Visit  Medication Sig Dispense Refill   amoxicillin-clavulanate (AUGMENTIN) 875-125 MG tablet Take 1 tablet by mouth 2 (two) times daily.     ibuprofen (ADVIL) 800 MG tablet Take 1 tablet (800 mg total) by mouth 3 (three) times daily. 30 tablet 0   No current  facility-administered medications on file prior to visit.     No Known Allergies Social History   Socioeconomic History   Marital status: Married    Spouse name: Not on file   Number of children: Not on file   Years of education: Not on file   Highest education level: Not on file  Occupational History   Not on file  Tobacco Use   Smoking status: Never   Smokeless tobacco: Never  Substance and Sexual Activity   Alcohol use: No   Drug use: No   Sexual activity: Not on file  Other Topics Concern   Not on file  Social History Narrative   Not on file   Social Determinants of Health   Financial Resource Strain: Not on file  Food Insecurity: Not on file  Transportation Needs: Not on file  Physical Activity: Not on file  Stress: Not on file  Social Connections: Not on file  Intimate Partner Violence: Not on file   Family History  Problem Relation Age of Onset   Kidney disease Mother    Heart disease Maternal Grandmother    Cancer Paternal Grandfather    Kidney disease Paternal Grandfather       Review of Systems  Respiratory:  Positive for cough.   All other systems reviewed and are negative.      Objective:   Physical Exam Vitals reviewed.  Constitutional:      General:  He is not in acute distress.    Appearance: He is well-developed. He is not diaphoretic.  HENT:     Head: Normocephalic and atraumatic.     Right Ear: External ear normal.     Left Ear: External ear normal.     Nose: Nose normal.     Mouth/Throat:     Pharynx: No oropharyngeal exudate.  Eyes:     General: No scleral icterus.       Right eye: No discharge.        Left eye: No discharge.     Conjunctiva/sclera: Conjunctivae normal.     Pupils: Pupils are equal, round, and reactive to light.  Neck:     Thyroid: No thyromegaly.     Vascular: No JVD.     Trachea: No tracheal deviation.  Cardiovascular:     Rate and Rhythm: Normal rate and regular rhythm.     Heart sounds: Normal heart  sounds. No murmur heard.    No friction rub. No gallop.  Pulmonary:     Effort: Pulmonary effort is normal. No respiratory distress.     Breath sounds: Normal breath sounds. No stridor. No wheezing or rales.  Chest:     Chest wall: No tenderness.  Abdominal:     General: Bowel sounds are normal. There is no distension.     Palpations: Abdomen is soft. There is no mass.     Tenderness: There is no abdominal tenderness. There is no guarding or rebound.  Genitourinary:    Prostate: Normal.     Rectum: Normal.  Musculoskeletal:     Cervical back: Normal range of motion and neck supple.  Lymphadenopathy:     Cervical: No cervical adenopathy.  Skin:    General: Skin is warm.     Coloration: Skin is not pale.     Findings: No erythema or rash.  Neurological:     Mental Status: He is alert and oriented to person, place, and time.     Cranial Nerves: No cranial nerve deficit.     Motor: No abnormal muscle tone.     Coordination: Coordination normal.     Deep Tendon Reflexes: Reflexes are normal and symmetric.  Psychiatric:        Behavior: Behavior normal.        Thought Content: Thought content normal.        Judgment: Judgment normal.           Assessment & Plan:  Prostate cancer screening - Plan: PSA  Pure hypercholesterolemia - Plan: CBC with Differential/Platelet, COMPLETE METABOLIC PANEL WITH GFR, Lipid panel  General medical examination Physical exam today is completely normal.  Blood pressure is excellent.  Patient received his flu shot today.  I encouraged him to get the shingles vaccine as well as a COVID booster.  I will check with his gastroenterologist regarding his last colonoscopy.  I will screen for prostate cancer with a PSA.  Check CBC, CMP, and lipid panel.  The remainder of his physical exam is completely normal.

## 2022-01-21 ENCOUNTER — Telehealth: Payer: Self-pay

## 2022-01-21 NOTE — Telephone Encounter (Signed)
Per Dr. Tanya Nones:  When did he have his last colonoscopy?  Please call [facility].  Thanks

## 2022-02-01 ENCOUNTER — Ambulatory Visit: Payer: Federal, State, Local not specified - PPO | Admitting: Family Medicine

## 2022-02-01 VITALS — BP 132/76 | HR 91 | Ht 70.0 in | Wt 177.0 lb

## 2022-02-01 DIAGNOSIS — B028 Zoster with other complications: Secondary | ICD-10-CM

## 2022-02-01 MED ORDER — OXYCODONE-ACETAMINOPHEN 5-325 MG PO TABS: 1.0000 | ORAL_TABLET | Freq: Four times a day (QID) | ORAL | 0 refills | Status: DC | PRN

## 2022-02-01 MED ORDER — VALACYCLOVIR HCL 1 G PO TABS: 1000.0000 mg | ORAL_TABLET | Freq: Three times a day (TID) | ORAL | 0 refills | Status: AC

## 2022-02-01 NOTE — Progress Notes (Signed)
Subjective:    Patient ID: Phillip Benson, male    DOB: 09-20-1960, 61 y.o.   MRN: [id number]  HPI Starting at the end of last week, the patient developed a pain below his left scapula.  He now has a blisterlike rash below his left scapula that radiates around to his left anterior chest stopping at the sternum.  The rash consist of blisters on erythematous base in a dermatomal pattern Past Medical History:  Diagnosis Date   Gastritis and duodenitis    egd-2017   Personal history of kidney stones 2008   x 1   Past Surgical History:  Procedure Laterality Date   colonscopy  may 2017   benign polyps removed   ESOPHAGOGASTRODUODENOSCOPY ENDOSCOPY  may 2017   KIDNEY STONE SURGERY  2008   KNEE ARTHROSCOPY WITH LATERAL MENISECTOMY Right 07/22/2015   Procedure: RIGHT KNEE ARTHROSCOPY WITH MEDIAL MENISCAL DEBRIDEMENT ;  Surgeon: Ollen Gross, MD;  Location: [facility];  Service: Orthopedics;  Laterality: Right;   Current Outpatient Medications on File Prior to Visit  Medication Sig Dispense Refill   amoxicillin-clavulanate (AUGMENTIN) 875-125 MG tablet Take 1 tablet by mouth 2 (two) times daily.     ibuprofen (ADVIL) 800 MG tablet Take 1 tablet (800 mg total) by mouth 3 (three) times daily. 30 tablet 0   No current facility-administered medications on file prior to visit.   No Known Allergies Social History   Socioeconomic History   Marital status: Married    Spouse name: Not on file   Number of children: Not on file   Years of education: Not on file   Highest education level: Not on file  Occupational History   Not on file  Tobacco Use   Smoking status: Never   Smokeless tobacco: Never  Substance and Sexual Activity   Alcohol use: No   Drug use: No   Sexual activity: Not on file  Other Topics Concern   Not on file  Social History Narrative   Not on file   Social Determinants of Health   Financial Resource Strain: Not on file  Food Insecurity: Not on file  Transportation  Needs: Not on file  Physical Activity: Not on file  Stress: Not on file  Social Connections: Not on file  Intimate Partner Violence: Not on file      Review of Systems  All other systems reviewed and are negative.      Objective:   Physical Exam Vitals reviewed.  Constitutional:      General: He is not in acute distress.    Appearance: He is well-developed. He is not diaphoretic.  HENT:     Right Ear: External ear normal.     Left Ear: External ear normal.     Nose: Nose normal.     Mouth/Throat:     Pharynx: No oropharyngeal exudate.  Eyes:     Conjunctiva/sclera: Conjunctivae normal.  Cardiovascular:     Rate and Rhythm: Normal rate and regular rhythm.     Heart sounds: Normal heart sounds. No murmur heard. Pulmonary:     Effort: Pulmonary effort is normal. No respiratory distress.     Breath sounds: Normal breath sounds. No wheezing or rales.    Chest:    Abdominal:     General: Bowel sounds are normal.     Palpations: Abdomen is soft.  Musculoskeletal:     Cervical back: Neck supple.  Lymphadenopathy:     Cervical: No cervical adenopathy.  Skin:  Findings: Erythema and rash present. Rash is vesicular.           Assessment & Plan:  Herpes zoster with other complication Begin Valtrex 1 g p.o. 3 times daily for 7 days, use ibuprofen 800 mg every 8 hours as needed for pain.  Supplement with Percocet for breakthrough pain

## 2022-02-07 DIAGNOSIS — L905 Scar conditions and fibrosis of skin: Secondary | ICD-10-CM | POA: Diagnosis not present

## 2022-02-07 DIAGNOSIS — D485 Neoplasm of uncertain behavior of skin: Secondary | ICD-10-CM | POA: Diagnosis not present

## 2022-04-18 DIAGNOSIS — L570 Actinic keratosis: Secondary | ICD-10-CM | POA: Diagnosis not present

## 2022-04-18 DIAGNOSIS — X32XXXD Exposure to sunlight, subsequent encounter: Secondary | ICD-10-CM | POA: Diagnosis not present

## 2022-07-12 ENCOUNTER — Ambulatory Visit: Payer: Federal, State, Local not specified - PPO | Admitting: Family Medicine

## 2022-07-12 ENCOUNTER — Encounter: Payer: Self-pay | Admitting: Family Medicine

## 2022-07-12 VITALS — BP 130/70 | HR 78 | Temp 98.1°F | Ht 70.0 in | Wt 173.0 lb

## 2022-07-12 DIAGNOSIS — R197 Diarrhea, unspecified: Secondary | ICD-10-CM | POA: Diagnosis not present

## 2022-07-12 MED ORDER — DIPHENOXYLATE-ATROPINE 2.5-0.025 MG PO TABS: 2.0000 | ORAL_TABLET | Freq: Four times a day (QID) | ORAL | 0 refills | Status: DC | PRN

## 2022-07-12 NOTE — Progress Notes (Signed)
Subjective:    Patient ID: Phillip Benson, male    DOB: 02-Sep-1960, 62 y.o.   MRN: [id number]  HPI Patient's father recently passed away.  During the visitation, the patient became ill.  He has had severe diarrhea for the last 7 days.  He reports 4-6 watery bowel movements every day.  As soon as he eats, he has biotic.  He denies hematochezia.  He denies any fever.  He does report cramping abdominal pain.  He denies any recent travel.  He has not been out of the country.  He has no, known, sick contacts.  His wife is not sick.  He has not been on any recent antibiotics.  However he feels weak and tired  Past Medical History:  Diagnosis Date   Gastritis and duodenitis    egd-2017   Personal history of kidney stones 2008   x 1   Past Surgical History:  Procedure Laterality Date   colonscopy  may 2017   benign polyps removed   ESOPHAGOGASTRODUODENOSCOPY ENDOSCOPY  may 2017   KIDNEY STONE SURGERY  2008   KNEE ARTHROSCOPY WITH LATERAL MENISECTOMY Right 07/22/2015   Procedure: RIGHT KNEE ARTHROSCOPY WITH MEDIAL MENISCAL DEBRIDEMENT ;  Surgeon: Ollen Gross, MD;  Location: [facility];  Service: Orthopedics;  Laterality: Right;   Current Outpatient Medications on File Prior to Visit  Medication Sig Dispense Refill   ibuprofen (ADVIL) 800 MG tablet Take 1 tablet (800 mg total) by mouth 3 (three) times daily. 30 tablet 0   oxyCODONE-acetaminophen (PERCOCET) 5-325 MG tablet Take 1-2 tablets by mouth every 6 (six) hours as needed for severe pain. 30 tablet 0   No current facility-administered medications on file prior to visit.   No Known Allergies Social History   Socioeconomic History   Marital status: Married    Spouse name: Not on file   Number of children: Not on file   Years of education: Not on file   Highest education level: Not on file  Occupational History   Not on file  Tobacco Use   Smoking status: Never   Smokeless tobacco: Never  Substance and Sexual Activity   Alcohol  use: No   Drug use: No   Sexual activity: Not on file  Other Topics Concern   Not on file  Social History Narrative   Not on file   Social Determinants of Health   Financial Resource Strain: Not on file  Food Insecurity: Not on file  Transportation Needs: Not on file  Physical Activity: Not on file  Stress: Not on file  Social Connections: Not on file  Intimate Partner Violence: Not on file      Review of Systems  All other systems reviewed and are negative.      Objective:   Physical Exam Vitals reviewed.  Constitutional:      General: He is not in acute distress.    Appearance: He is well-developed. He is not diaphoretic.  HENT:     Right Ear: External ear normal.     Left Ear: External ear normal.     Nose: Nose normal.     Mouth/Throat:     Pharynx: No oropharyngeal exudate.  Eyes:     Conjunctiva/sclera: Conjunctivae normal.  Cardiovascular:     Rate and Rhythm: Normal rate and regular rhythm.     Heart sounds: Normal heart sounds. No murmur heard. Pulmonary:     Effort: Pulmonary effort is normal. No respiratory distress.  Breath sounds: Normal breath sounds. No wheezing or rales.  Abdominal:     General: Abdomen is flat. Bowel sounds are increased. There is no distension.     Palpations: Abdomen is soft.     Tenderness: There is no abdominal tenderness. There is no guarding or rebound.  Musculoskeletal:     Cervical back: Neck supple.  Lymphadenopathy:     Cervical: No cervical adenopathy.           Assessment & Plan:  Diarrhea, unspecified type - Plan: Salmonella/Shigella Cult, Campy EIA and Shiga Toxin reflex I believe the patient's symptoms are consistent with a viral gastroenteritis most likely from the sick contact at the funeral.  Recommended pushing fluids such as Gatorade.  He is a little 1-2 tabs every 6 hours as needed for severe diarrhea.  I recommended that he use the medicine sparingly just to "slow down the diarrhea but not cause  constipation".  If the diarrhea persist into next week or if he develops bloody diarrhea or fevers I want him to return a stool culture to rule out bacterial gastroenteritis.  I believe that he should be better by Monday

## 2022-09-16 DIAGNOSIS — L255 Unspecified contact dermatitis due to plants, except food: Secondary | ICD-10-CM | POA: Diagnosis not present

## 2022-09-19 DIAGNOSIS — D485 Neoplasm of uncertain behavior of skin: Secondary | ICD-10-CM | POA: Diagnosis not present

## 2022-09-19 DIAGNOSIS — D2271 Melanocytic nevi of right lower limb, including hip: Secondary | ICD-10-CM | POA: Diagnosis not present

## 2022-09-19 DIAGNOSIS — L255 Unspecified contact dermatitis due to plants, except food: Secondary | ICD-10-CM | POA: Diagnosis not present

## 2022-09-19 DIAGNOSIS — Z1283 Encounter for screening for malignant neoplasm of skin: Secondary | ICD-10-CM | POA: Diagnosis not present

## 2022-09-19 DIAGNOSIS — C44629 Squamous cell carcinoma of skin of left upper limb, including shoulder: Secondary | ICD-10-CM | POA: Diagnosis not present

## 2022-09-19 DIAGNOSIS — D225 Melanocytic nevi of trunk: Secondary | ICD-10-CM | POA: Diagnosis not present

## 2022-11-21 DIAGNOSIS — Z85828 Personal history of other malignant neoplasm of skin: Secondary | ICD-10-CM | POA: Diagnosis not present

## 2022-11-21 DIAGNOSIS — Z08 Encounter for follow-up examination after completed treatment for malignant neoplasm: Secondary | ICD-10-CM | POA: Diagnosis not present

## 2022-11-21 DIAGNOSIS — L820 Inflamed seborrheic keratosis: Secondary | ICD-10-CM | POA: Diagnosis not present

## 2023-01-24 ENCOUNTER — Ambulatory Visit (INDEPENDENT_AMBULATORY_CARE_PROVIDER_SITE_OTHER): Payer: Federal, State, Local not specified - PPO | Admitting: Family Medicine

## 2023-01-24 ENCOUNTER — Encounter: Payer: Self-pay | Admitting: Family Medicine

## 2023-01-24 VITALS — BP 122/80 | HR 67 | Temp 98.2°F | Ht 70.0 in | Wt 173.5 lb

## 2023-01-24 DIAGNOSIS — Z0000 Encounter for general adult medical examination without abnormal findings: Secondary | ICD-10-CM

## 2023-01-24 DIAGNOSIS — Z0001 Encounter for general adult medical examination with abnormal findings: Secondary | ICD-10-CM | POA: Diagnosis not present

## 2023-01-24 DIAGNOSIS — E7800 Pure hypercholesterolemia, unspecified: Secondary | ICD-10-CM

## 2023-01-24 DIAGNOSIS — Z23 Encounter for immunization: Secondary | ICD-10-CM

## 2023-01-24 DIAGNOSIS — Z125 Encounter for screening for malignant neoplasm of prostate: Secondary | ICD-10-CM

## 2023-01-24 DIAGNOSIS — Z1211 Encounter for screening for malignant neoplasm of colon: Secondary | ICD-10-CM

## 2023-01-24 NOTE — Progress Notes (Signed)
Subjective:    Patient ID: Phillip Benson, male    DOB: 1960/10/01, 62 y.o.   MRN: [id number]   Patient is a very pleasant 62 year old Caucasian gentleman here today for physical exam.  He has been very physically active.  [place]  He walks almost daily.  He denies any angina or shortness of breath or chest pain.  Overall he has been doing well.  He is due prostate screening.  He is also due for colon cancer screening.  We discussed this and he would like to do the Cologuard.  He is due for a flu shot.  His tetanus shot is not due until 2027.  He is due for RSV, Shingrix, and COVID.  The patient had shingles last year so he declines Shingrix.  He politely declines COVID and RSV.  He will consent to a flu shot.  Otherwise he is doing well with no concerns.  Immunization History  Administered Date(s) Administered   Influenza,inj,Quad PF,6+ Mos 12/15/2014, 01/18/2022   Influenza-Unspecified 12/06/2014   Moderna Sars-Covid-2 Vaccination 07/04/2019, 08/01/2019   Rabies, IM 03/12/2021, 03/15/2021, 03/19/2021, 03/26/2021   Tdap 04/30/2015    Past Medical History:  Diagnosis Date   Gastritis and duodenitis    egd-2017   Personal history of kidney stones 2008   x 1   Past Surgical History:  Procedure Laterality Date   colonscopy  may 2017   benign polyps removed   ESOPHAGOGASTRODUODENOSCOPY ENDOSCOPY  may 2017   KIDNEY STONE SURGERY  2008   KNEE ARTHROSCOPY WITH LATERAL MENISECTOMY Right 07/22/2015   Procedure: RIGHT KNEE ARTHROSCOPY WITH MEDIAL MENISCAL DEBRIDEMENT ;  Surgeon: Ollen Gross, MD;  Location: [facility];  Service: Orthopedics;  Laterality: Right;   Current Outpatient Medications on File Prior to Visit  Medication Sig Dispense Refill   ibuprofen (ADVIL) 800 MG tablet Take 1 tablet (800 mg total) by mouth 3 (three) times daily. 30 tablet 0   diphenoxylate-atropine (LOMOTIL) 2.5-0.025 MG tablet Take 2 tablets by mouth 4 (four) times daily  as needed for diarrhea or loose stools. (Patient not taking: Reported on 01/24/2023) 30 tablet 0   No current facility-administered medications on file prior to visit.     No Known Allergies Social History   Socioeconomic History   Marital status: Married    Spouse name: Not on file   Number of children: Not on file   Years of education: Not on file   Highest education level: Not on file  Occupational History   Not on file  Tobacco Use   Smoking status: Never   Smokeless tobacco: Never  Substance and Sexual Activity   Alcohol use: No   Drug use: No   Sexual activity: Not on file  Other Topics Concern   Not on file  Social History Narrative   Not on file   Social Determinants of Health   Financial Resource Strain: Not on file  Food Insecurity: Not on file  Transportation Needs: Not on file  Physical Activity: Not on file  Stress: Not on file  Social Connections: Unknown (07/20/2021)   Received from [facility], [facility]   Social Network    Social Network: Not on file  Intimate Partner Violence: Unknown (06/11/2021)   Received from [facility], [facility]   HITS    Physically Hurt: Not on file    Insult or Talk Down To: Not on file    Threaten Physical Harm: Not on  file    Scream or Curse: Not on file   Family History  Problem Relation Age of Onset   Kidney disease Mother    Heart disease Maternal Grandmother    Cancer Paternal Grandfather    Kidney disease Paternal Grandfather       Review of Systems  All other systems reviewed and are negative.      Objective:   Physical Exam Vitals reviewed.  Constitutional:      General: He is not in acute distress.    Appearance: Normal appearance. He is well-developed and normal weight. He is not ill-appearing, toxic-appearing or diaphoretic.  HENT:     Head: Normocephalic and atraumatic.     Right Ear: Tympanic membrane, ear canal and external ear normal.     Left Ear: Tympanic membrane, ear canal  and external ear normal.     Nose: Nose normal. No congestion or rhinorrhea.     Mouth/Throat:     Mouth: Mucous membranes are moist.     Pharynx: No oropharyngeal exudate.  Eyes:     General: No scleral icterus.       Right eye: No discharge.        Left eye: No discharge.     Conjunctiva/sclera: Conjunctivae normal.     Pupils: Pupils are equal, round, and reactive to light.  Neck:     Thyroid: No thyromegaly.     Vascular: No carotid bruit or JVD.     Trachea: No tracheal deviation.  Cardiovascular:     Rate and Rhythm: Normal rate and regular rhythm.     Heart sounds: Normal heart sounds. No murmur heard.    No friction rub. No gallop.  Pulmonary:     Effort: Pulmonary effort is normal. No respiratory distress.     Breath sounds: Normal breath sounds. No stridor. No wheezing or rales.  Chest:     Chest wall: No tenderness.  Abdominal:     General: Bowel sounds are normal. There is no distension.     Palpations: Abdomen is soft. There is no mass.     Tenderness: There is no abdominal tenderness. There is no guarding or rebound.  Musculoskeletal:     Cervical back: Normal range of motion and neck supple.     Right lower leg: No edema.     Left lower leg: No edema.  Lymphadenopathy:     Cervical: No cervical adenopathy.  Skin:    General: Skin is warm.     Coloration: Skin is not jaundiced or pale.     Findings: No bruising, erythema, lesion or rash.  Neurological:     General: No focal deficit present.     Mental Status: He is alert and oriented to person, place, and time. Mental status is at baseline.     Cranial Nerves: No cranial nerve deficit.     Sensory: No sensory deficit.     Motor: No weakness or abnormal muscle tone.     Coordination: Coordination normal.     Gait: Gait normal.     Deep Tendon Reflexes: Reflexes are normal and symmetric. Reflexes normal.  Psychiatric:        Mood and Affect: Mood normal.        Behavior: Behavior normal.        Thought  Content: Thought content normal.        Judgment: Judgment normal.           Assessment & Plan:  Colon cancer screening -  Plan: Cologuard  Prostate cancer screening - Plan: PSA  Pure hypercholesterolemia - Plan: CBC with Differential/Platelet, COMPLETE METABOLIC PANEL WITH GFR, Lipid panel  General medical examination  Patient's physical exam today is normal.  I will screen for prostate cancer with PSA.  I will screen for colon cancer with a Cologuard.  Blood pressure is outstanding.  Monitor his cholesterol which was slightly high last year with a CMP and a lipid panel.  The patient received his flu shot today.  He politely declined COVID, Shingrix, and RSV.

## 2023-01-24 NOTE — Addendum Note (Signed)
Addended by: Venia Carbon K on: 01/24/2023 08:59 AM   Modules accepted: Orders

## 2023-01-25 LAB — CBC WITH DIFFERENTIAL/PLATELET
Absolute Lymphocytes: 1575 {cells}/uL (ref 850–3900)
Absolute Monocytes: 513 {cells}/uL (ref 200–950)
Basophils Absolute: 18 {cells}/uL (ref 0–200)
Basophils Relative: 0.3 %
Eosinophils Absolute: 100 {cells}/uL (ref 15–500)
Eosinophils Relative: 1.7 %
HCT: 51.4 % — ABNORMAL HIGH (ref 38.5–50.0)
Hemoglobin: 17.2 g/dL — ABNORMAL HIGH (ref 13.2–17.1)
MCH: 31 pg (ref 27.0–33.0)
MCHC: 33.5 g/dL (ref 32.0–36.0)
MCV: 92.8 fL (ref 80.0–100.0)
MPV: 10.2 fL (ref 7.5–12.5)
Monocytes Relative: 8.7 %
Neutro Abs: 3693 {cells}/uL (ref 1500–7800)
Neutrophils Relative %: 62.6 %
Platelets: 255 10*3/uL (ref 140–400)
RBC: 5.54 10*6/uL (ref 4.20–5.80)
RDW: 12.2 % (ref 11.0–15.0)
Total Lymphocyte: 26.7 %
WBC: 5.9 10*3/uL (ref 3.8–10.8)

## 2023-01-25 LAB — COMPLETE METABOLIC PANEL WITH GFR
AG Ratio: 1.9 (calc) (ref 1.0–2.5)
ALT: 19 U/L (ref 9–46)
AST: 21 U/L (ref 10–35)
Albumin: 4.2 g/dL (ref 3.6–5.1)
Alkaline phosphatase (APISO): 61 U/L (ref 35–144)
BUN: 14 mg/dL (ref 7–25)
CO2: 28 mmol/L (ref 20–32)
Calcium: 9.4 mg/dL (ref 8.6–10.3)
Chloride: 103 mmol/L (ref 98–110)
Creat: 0.94 mg/dL (ref 0.70–1.35)
Globulin: 2.2 g/dL (ref 1.9–3.7)
Glucose, Bld: 93 mg/dL (ref 65–99)
Potassium: 4.6 mmol/L (ref 3.5–5.3)
Sodium: 140 mmol/L (ref 135–146)
Total Bilirubin: 0.6 mg/dL (ref 0.2–1.2)
Total Protein: 6.4 g/dL (ref 6.1–8.1)
eGFR: 91 mL/min/{1.73_m2} (ref >=60)

## 2023-01-25 LAB — PSA: PSA: 3.53 ng/mL (ref <=4.00)

## 2023-01-25 LAB — LIPID PANEL
Cholesterol: 187 mg/dL (ref <200)
HDL: 54 mg/dL (ref >=40)
LDL Cholesterol (Calc): 111 mg/dL — ABNORMAL HIGH
Non-HDL Cholesterol (Calc): 133 mg/dL — ABNORMAL HIGH (ref <130)
Total CHOL/HDL Ratio: 3.5 (calc) (ref <5.0)
Triglycerides: 113 mg/dL (ref <150)

## 2023-02-09 LAB — COLOGUARD: COLOGUARD: NEGATIVE

## 2023-02-23 ENCOUNTER — Other Ambulatory Visit: Payer: Federal, State, Local not specified - PPO

## 2023-02-23 DIAGNOSIS — Z125 Encounter for screening for malignant neoplasm of prostate: Secondary | ICD-10-CM | POA: Diagnosis not present

## 2023-02-24 LAB — PSA: PSA: 2.5 ng/mL (ref <=4.00)

## 2023-02-27 ENCOUNTER — Encounter: Payer: Self-pay | Admitting: Family Medicine

## 2023-02-27 ENCOUNTER — Ambulatory Visit: Payer: Federal, State, Local not specified - PPO | Admitting: Family Medicine

## 2023-02-27 VITALS — BP 124/80 | HR 65 | Temp 98.5°F | Ht 70.0 in | Wt 177.2 lb

## 2023-02-27 DIAGNOSIS — Z125 Encounter for screening for malignant neoplasm of prostate: Secondary | ICD-10-CM | POA: Diagnosis not present

## 2023-02-27 NOTE — Progress Notes (Signed)
Subjective:    Patient ID: Phillip Benson, male    DOB: 01/25/1961, 62 y.o.   MRN: [id number]  HPI PSA was recently elevated 3.5 up from 2.2.  I repeated it later and found it to be improved at 2.5.  Denies dysuria or urgency or frequency.  Past Medical History:  Diagnosis Date   Gastritis and duodenitis    egd-2017   Personal history of kidney stones 2008   x 1   Past Surgical History:  Procedure Laterality Date   colonscopy  may 2017   benign polyps removed   ESOPHAGOGASTRODUODENOSCOPY ENDOSCOPY  may 2017   KIDNEY STONE SURGERY  2008   KNEE ARTHROSCOPY WITH LATERAL MENISECTOMY Right 07/22/2015   Procedure: RIGHT KNEE ARTHROSCOPY WITH MEDIAL MENISCAL DEBRIDEMENT ;  Surgeon: Ollen Gross, MD;  Location: [facility];  Service: Orthopedics;  Laterality: Right;   Current Outpatient Medications on File Prior to Visit  Medication Sig Dispense Refill   diphenoxylate-atropine (LOMOTIL) 2.5-0.025 MG tablet Take 2 tablets by mouth 4 (four) times daily as needed for diarrhea or loose stools. (Patient not taking: Reported on 01/24/2023) 30 tablet 0   ibuprofen (ADVIL) 800 MG tablet Take 1 tablet (800 mg total) by mouth 3 (three) times daily. 30 tablet 0   No current facility-administered medications on file prior to visit.   No Known Allergies Social History   Socioeconomic History   Marital status: Married    Spouse name: Not on file   Number of children: Not on file   Years of education: Not on file   Highest education level: Not on file  Occupational History   Not on file  Tobacco Use   Smoking status: Never   Smokeless tobacco: Never  Substance and Sexual Activity   Alcohol use: No   Drug use: No   Sexual activity: Not on file  Other Topics Concern   Not on file  Social History Narrative   Not on file   Social Drivers of Health   Financial Resource Strain: Not on file  Food Insecurity: Not on file  Transportation Needs: Not on file  Physical Activity: Not on file   Stress: Not on file  Social Connections: Unknown (07/20/2021)   Received from [facility], [facility]   Social Network    Social Network: Not on file  Intimate Partner Violence: Unknown (06/11/2021)   Received from [facility], [facility]   HITS    Physically Hurt: Not on file    Insult or Talk Down To: Not on file    Threaten Physical Harm: Not on file    Scream or Curse: Not on file      Review of Systems  All other systems reviewed and are negative.      Objective:   Physical Exam Vitals reviewed.  Constitutional:      General: He is not in acute distress.    Appearance: He is well-developed. He is not diaphoretic.  HENT:     Right Ear: External ear normal.     Left Ear: External ear normal.     Nose: Nose normal.     Mouth/Throat:     Pharynx: No oropharyngeal exudate.  Eyes:     Conjunctiva/sclera: Conjunctivae normal.  Cardiovascular:     Rate and Rhythm: Normal rate and regular rhythm.     Heart sounds: Normal heart sounds. No murmur heard. Pulmonary:     Effort: Pulmonary effort is normal. No respiratory distress.  Breath sounds: Normal breath sounds. No wheezing or rales.  Abdominal:     General: Abdomen is flat. Bowel sounds are increased. There is no distension.     Palpations: Abdomen is soft.     Tenderness: There is no abdominal tenderness. There is no guarding or rebound.  Genitourinary:    Prostate: Enlarged. Not tender and no nodules present.     Rectum: Normal.  Musculoskeletal:     Cervical back: Neck supple.  Lymphadenopathy:     Cervical: No cervical adenopathy.           Assessment & Plan:  Prostate cancer screening Dre is benign.  Recheck psa in 6 months.  Likely bph, we assume transient elevation was due to riding bicycle recently.

## 2023-03-18 IMAGING — DX DG WRIST COMPLETE 3+V*R*
3 series · 3 of 3 positions shown · non-contrast
Comparison: None Available.

CLINICAL DATA: Fall, pain

EXAM:
RIGHT WRIST - COMPLETE 3+ VIEW

[wrist pa]
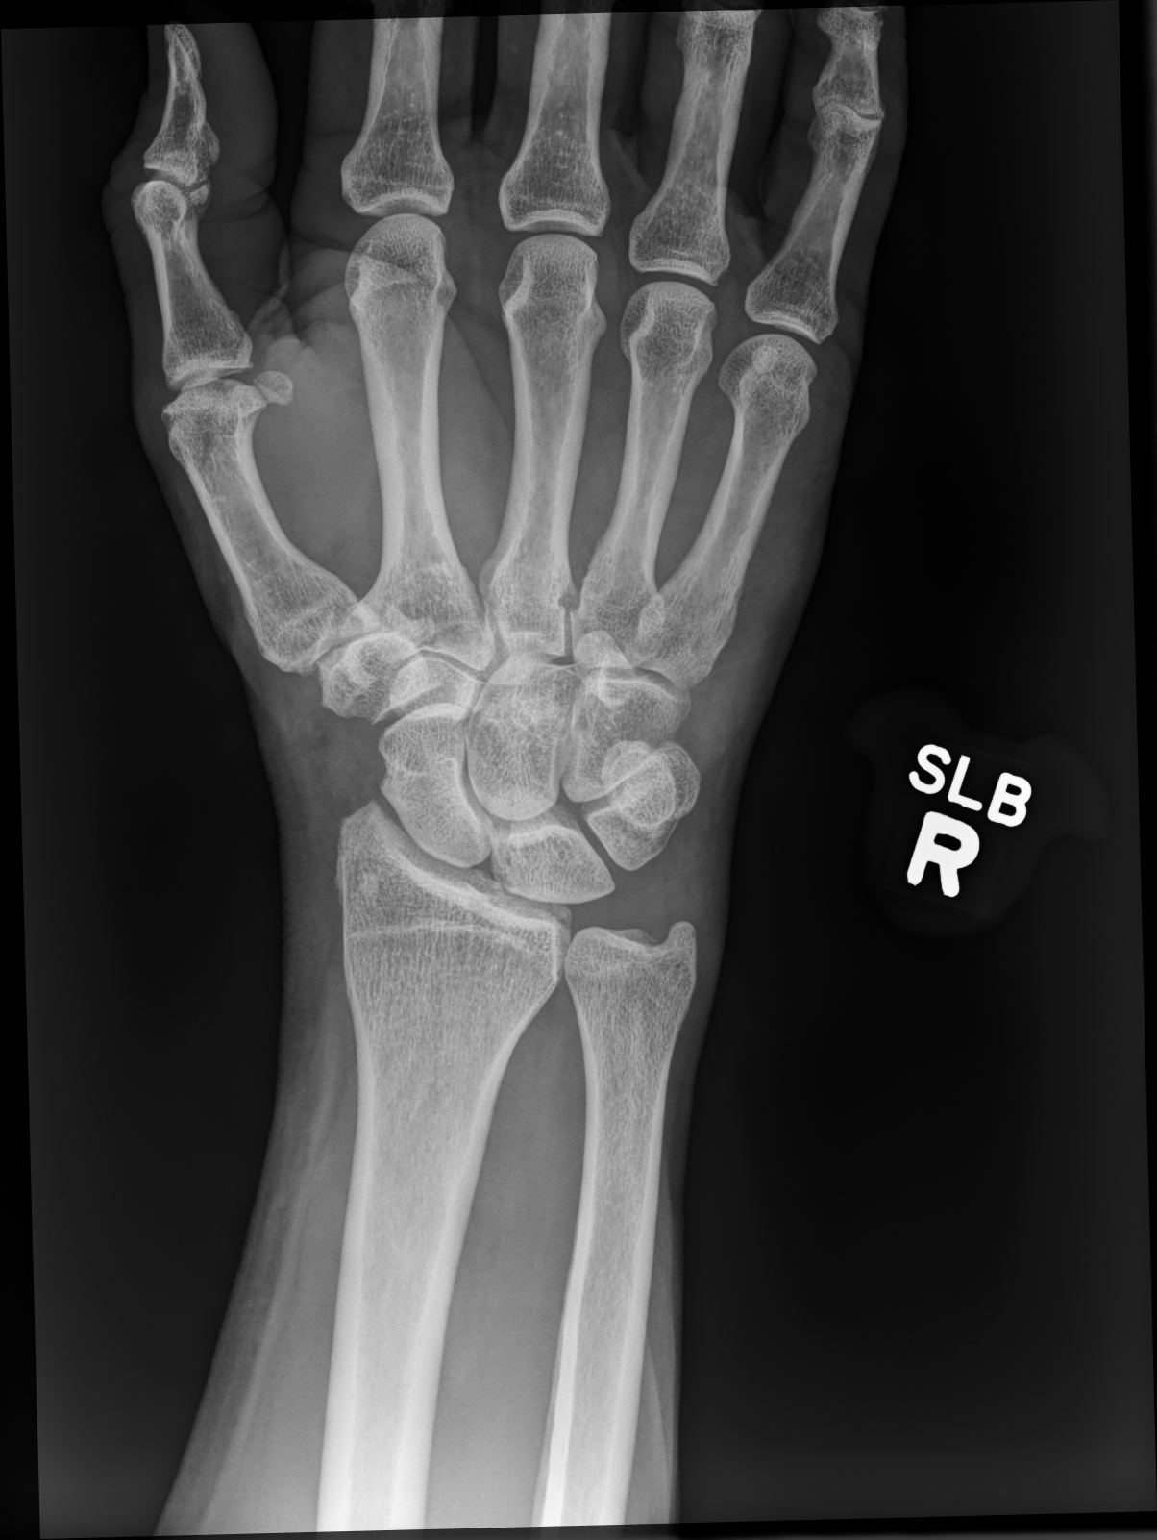

[wrist mlo]
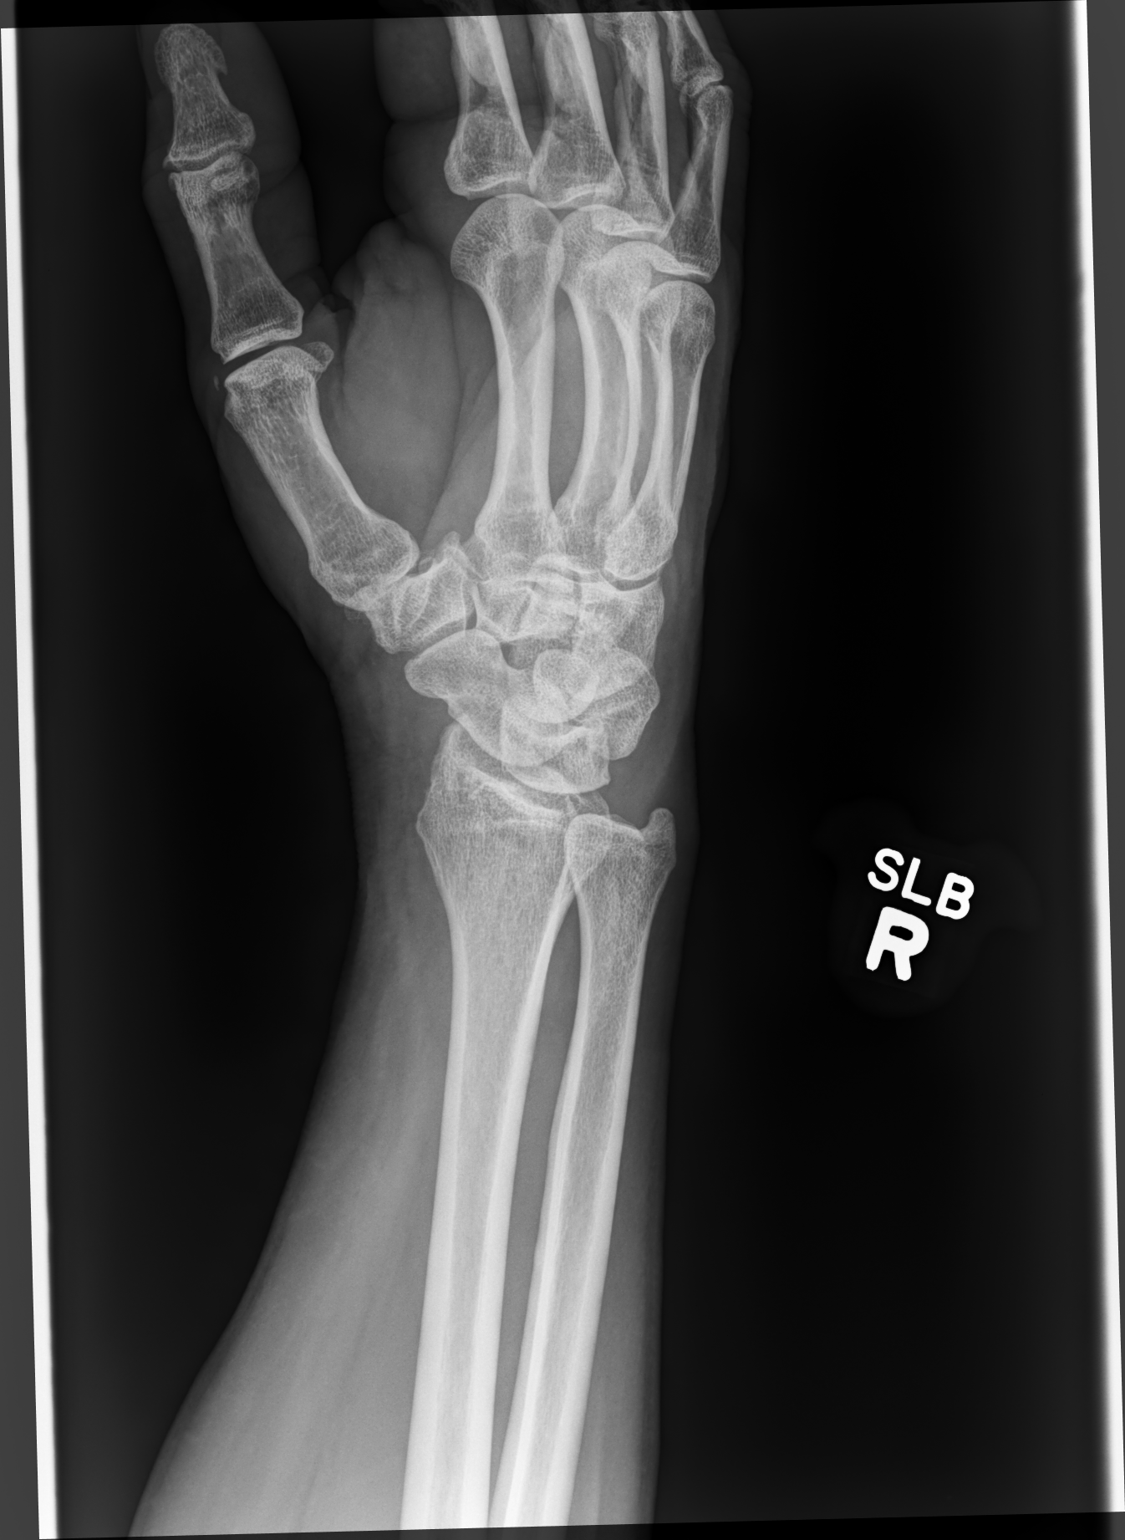

[wrist lat]
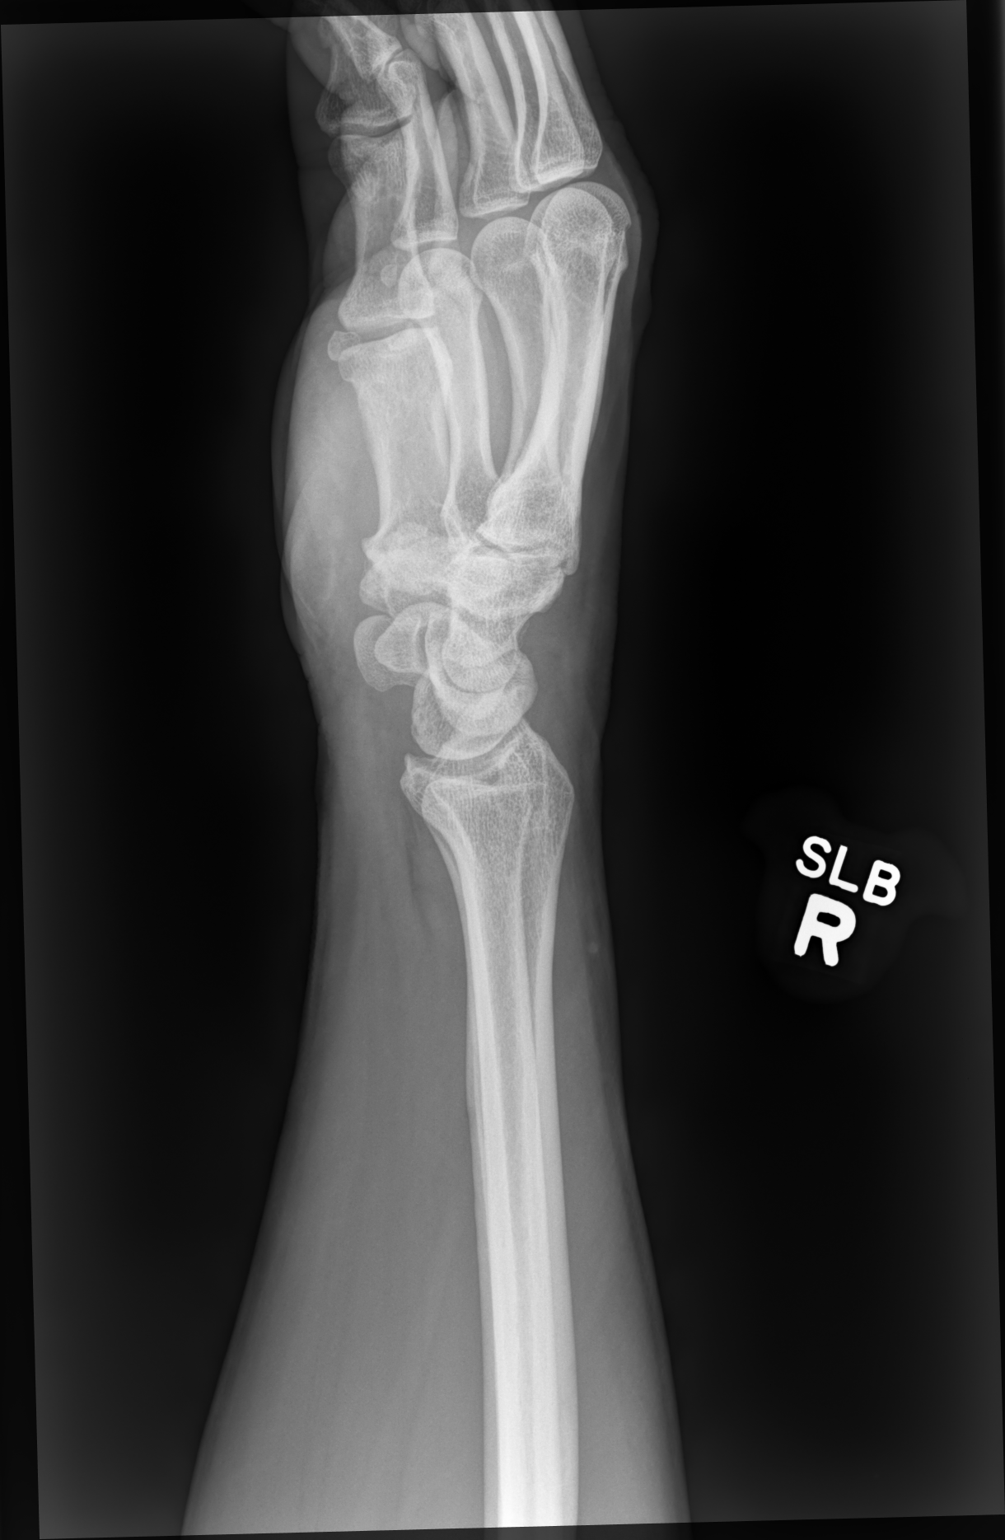

[3 of 3 positions shown; findings below may reference images not displayed]

FINDINGS: There is no evidence of fracture or dislocation. There is no
evidence of arthropathy or other focal bone abnormality. Soft
tissues are unremarkable.
IMPRESSION: No fracture or dislocation of the right wrist. The carpus is
normally aligned.

## 2023-05-22 DIAGNOSIS — L820 Inflamed seborrheic keratosis: Secondary | ICD-10-CM | POA: Diagnosis not present

## 2023-05-22 DIAGNOSIS — Z08 Encounter for follow-up examination after completed treatment for malignant neoplasm: Secondary | ICD-10-CM | POA: Diagnosis not present

## 2023-05-22 DIAGNOSIS — D225 Melanocytic nevi of trunk: Secondary | ICD-10-CM | POA: Diagnosis not present

## 2023-05-22 DIAGNOSIS — Z1283 Encounter for screening for malignant neoplasm of skin: Secondary | ICD-10-CM | POA: Diagnosis not present

## 2023-05-22 DIAGNOSIS — Z85828 Personal history of other malignant neoplasm of skin: Secondary | ICD-10-CM | POA: Diagnosis not present

## 2023-11-22 ENCOUNTER — Telehealth: Payer: Self-pay

## 2023-11-22 NOTE — Telephone Encounter (Signed)
 Copied from CRM [id number]. Topic: Clinical - Request for Lab/Test Order >> Nov 22, 2023 12:05 PM Tinnie C wrote: Reason for CRM: Wife Mylinda (on [place]) calling to see if Timon is supposed to come back in to get his PSA rechecked. She also wants to know if it is a fasting test. No lab orders in chart but pt has lab visit scheduled for 11/17 for fasting labs with physical. Please give pt at [phone]

## 2023-11-23 ENCOUNTER — Other Ambulatory Visit

## 2023-11-23 DIAGNOSIS — Z125 Encounter for screening for malignant neoplasm of prostate: Secondary | ICD-10-CM

## 2023-11-24 ENCOUNTER — Ambulatory Visit: Payer: Self-pay | Admitting: Family Medicine

## 2023-11-24 LAB — PSA: PSA: 5.27 ng/mL — ABNORMAL HIGH (ref <=4.00)

## 2023-11-28 ENCOUNTER — Telehealth: Payer: Self-pay

## 2023-11-28 NOTE — Telephone Encounter (Signed)
 Copied from CRM [id number]. Topic: Clinical - Request for Lab/Test Order >> Nov 22, 2023 12:05 PM Tinnie C wrote: Reason for CRM: Wife Phillip Benson (on [place]) calling to see if Phillip Benson is supposed to come back in to get his PSA rechecked. She also wants to know if it is a fasting test. No lab orders in chart but pt has lab visit scheduled for 11/17 for fasting labs with physical. Please give pt at [phone] >> Nov 28, 2023  1:44 PM Emylou G wrote: Relayed labs.. he is interested in setting up urology referral.. and still would like a call back to discuss [phone]

## 2023-11-29 ENCOUNTER — Telehealth: Payer: Self-pay

## 2023-11-29 NOTE — Telephone Encounter (Signed)
 Addressed in separate TE from 11/28/23. Mjp,lpn   Copied from CRM [id number]. Topic: Clinical - Request for Lab/Test Order >> Nov 22, 2023 12:05 PM Tinnie C wrote: Reason for CRM: Wife Mylinda ([place]) calling to see if Jorge is supposed to come back in to get his PSA rechecked. She also wants to know if it is a fasting test. No lab orders in chart but pt has lab visit scheduled for 11/17 for fasting labs with physical. Please give pt at [phone] >> Nov 28, 2023  1:44 PM Emylou G wrote: Relayed labs.. he is interested in setting up urology referral.. and still would like a call back to discuss [phone]

## 2023-12-06 ENCOUNTER — Other Ambulatory Visit: Payer: Self-pay

## 2023-12-06 DIAGNOSIS — R972 Elevated prostate specific antigen [PSA]: Secondary | ICD-10-CM

## 2024-01-22 ENCOUNTER — Other Ambulatory Visit

## 2024-01-22 ENCOUNTER — Other Ambulatory Visit: Payer: Federal, State, Local not specified - PPO

## 2024-01-22 DIAGNOSIS — Z0000 Encounter for general adult medical examination without abnormal findings: Secondary | ICD-10-CM

## 2024-01-22 DIAGNOSIS — E7800 Pure hypercholesterolemia, unspecified: Secondary | ICD-10-CM

## 2024-01-22 DIAGNOSIS — R972 Elevated prostate specific antigen [PSA]: Secondary | ICD-10-CM

## 2024-01-22 DIAGNOSIS — D751 Secondary polycythemia: Secondary | ICD-10-CM

## 2024-01-23 ENCOUNTER — Ambulatory Visit: Payer: Self-pay | Admitting: Family Medicine

## 2024-01-23 LAB — LIPID PANEL
Cholesterol: 187 mg/dL (ref <200)
HDL: 59 mg/dL (ref >=40)
LDL Cholesterol (Calc): 106 mg/dL — ABNORMAL HIGH
Non-HDL Cholesterol (Calc): 128 mg/dL (ref <130)
Total CHOL/HDL Ratio: 3.2 (calc) (ref <5.0)
Triglycerides: 120 mg/dL (ref <150)

## 2024-01-23 LAB — CBC WITH DIFFERENTIAL/PLATELET
Absolute Lymphocytes: 1745 {cells}/uL (ref 850–3900)
Absolute Monocytes: 421 {cells}/uL (ref 200–950)
Basophils Absolute: 18 {cells}/uL (ref 0–200)
Basophils Relative: 0.3 %
Eosinophils Absolute: 98 {cells}/uL (ref 15–500)
Eosinophils Relative: 1.6 %
HCT: 51.3 % — ABNORMAL HIGH (ref 38.5–50.0)
Hemoglobin: 17.4 g/dL — ABNORMAL HIGH (ref 13.2–17.1)
MCH: 31.3 pg (ref 27.0–33.0)
MCHC: 33.9 g/dL (ref 32.0–36.0)
MCV: 92.3 fL (ref 80.0–100.0)
MPV: 10.3 fL (ref 7.5–12.5)
Monocytes Relative: 6.9 %
Neutro Abs: 3819 {cells}/uL (ref 1500–7800)
Neutrophils Relative %: 62.6 %
Platelets: 244 Thousand/uL (ref 140–400)
RBC: 5.56 Million/uL (ref 4.20–5.80)
RDW: 12.4 % (ref 11.0–15.0)
Total Lymphocyte: 28.6 %
WBC: 6.1 Thousand/uL (ref 3.8–10.8)

## 2024-01-23 LAB — COMPLETE METABOLIC PANEL WITHOUT GFR
AG Ratio: 1.8 (calc) (ref 1.0–2.5)
ALT: 15 U/L (ref 9–46)
AST: 20 U/L (ref 10–35)
Albumin: 4.3 g/dL (ref 3.6–5.1)
Alkaline phosphatase (APISO): 57 U/L (ref 35–144)
BUN: 14 mg/dL (ref 7–25)
CO2: 27 mmol/L (ref 20–32)
Calcium: 9.4 mg/dL (ref 8.6–10.3)
Chloride: 102 mmol/L (ref 98–110)
Creat: 0.88 mg/dL (ref 0.70–1.35)
Globulin: 2.4 g/dL (ref 1.9–3.7)
Glucose, Bld: 88 mg/dL (ref 65–99)
Potassium: 4.6 mmol/L (ref 3.5–5.3)
Sodium: 138 mmol/L (ref 135–146)
Total Bilirubin: 0.9 mg/dL (ref 0.2–1.2)
Total Protein: 6.7 g/dL (ref 6.1–8.1)

## 2024-01-23 LAB — PSA: PSA: 3.2 ng/mL (ref <=4.00)

## 2024-01-29 ENCOUNTER — Encounter: Payer: Federal, State, Local not specified - PPO | Admitting: Family Medicine

## 2024-01-31 ENCOUNTER — Encounter: Payer: Self-pay | Admitting: Urology

## 2024-01-31 ENCOUNTER — Ambulatory Visit (INDEPENDENT_AMBULATORY_CARE_PROVIDER_SITE_OTHER): Admitting: Urology

## 2024-01-31 VITALS — BP 115/71 | HR 67

## 2024-01-31 DIAGNOSIS — R972 Elevated prostate specific antigen [PSA]: Secondary | ICD-10-CM | POA: Diagnosis not present

## 2024-01-31 LAB — URINALYSIS, ROUTINE W REFLEX MICROSCOPIC
Bilirubin, UA: NEGATIVE
Glucose, UA: NEGATIVE
Ketones, UA: NEGATIVE
Leukocytes,UA: NEGATIVE
Nitrite, UA: NEGATIVE
Protein,UA: NEGATIVE
Specific Gravity, UA: 1.025 (ref 1.005–1.030)
Urobilinogen, Ur: 1 mg/dL (ref 0.2–1.0)
pH, UA: 6 (ref 5.0–7.5)

## 2024-01-31 LAB — MICROSCOPIC EXAMINATION
Bacteria, UA: NONE SEEN
WBC, UA: NONE SEEN /HPF (ref 0–5)

## 2024-01-31 NOTE — Progress Notes (Signed)
 01/31/2024 1:03 PM   Phillip Benson 1960-10-16 [id number]  Referring provider: Duanne Butler DASEN, MD [address] [place],  [place] [address]  Elevated PSA   HPI: Mr Fearn is a 63yo here for evaluation of elevated PSA. PSA was 5.27 two months ago and rechecked was 3.2. His PSAs have ranged from 1.6-3.2 over the past 3 years. IPSS 2 QOL 0 on no BPH therapy. Nocturia 1x. Urine stream strong. No family hx of prostate cancer.    PMH: Past Medical History:  Diagnosis Date   Gastritis and duodenitis    egd-2017   Personal history of kidney stones 2008   x 1    Surgical History: Past Surgical History:  Procedure Laterality Date   colonscopy  may 2017   benign polyps removed   ESOPHAGOGASTRODUODENOSCOPY ENDOSCOPY  may 2017   KIDNEY STONE SURGERY  2008   KNEE ARTHROSCOPY WITH LATERAL MENISECTOMY Right 07/22/2015   Procedure: RIGHT KNEE ARTHROSCOPY WITH MEDIAL MENISCAL DEBRIDEMENT ;  Surgeon: Dempsey Moan, MD;  Location: [facility];  Service: Orthopedics;  Laterality: Right;    Home Medications:  Allergies as of 01/31/2024   No Known Allergies      Medication List        Accurate as of January 31, 2024  1:03 PM. If you have any questions, ask your nurse or doctor.          diphenoxylate -atropine  2.5-0.025 MG tablet Commonly known as: Lomotil  Take 2 tablets by mouth 4 (four) times daily as needed for diarrhea or loose stools.   ibuprofen  800 MG tablet Commonly known as: ADVIL  Take 1 tablet (800 mg total) by mouth 3 (three) times daily.        Allergies: No Known Allergies  Family History: Family History  Problem Relation Age of Onset   Kidney disease Mother    Heart disease Maternal Grandmother    Cancer Paternal Grandfather    Kidney disease Paternal Grandfather     Social History:  reports that he has never smoked. He has never used smokeless tobacco. He reports that he does not drink alcohol and does not use drugs.  ROS: All other review  of systems were reviewed and are negative except what is noted above in HPI  Physical Exam: BP 115/71   Pulse 67   Constitutional:  Alert and oriented, No acute distress. HEENT: [place] AT, moist mucus membranes.  Trachea midline, no masses. Cardiovascular: No clubbing, cyanosis, or edema. Respiratory: Normal respiratory effort, no increased work of breathing. GI: Abdomen is soft, nontender, nondistended, no abdominal masses GU: No CVA tenderness.  Lymph: No cervical or inguinal lymphadenopathy. Skin: No rashes, bruises or suspicious lesions. Neurologic: Grossly intact, no focal deficits, moving all 4 extremities. Psychiatric: Normal mood and affect.  Laboratory Data: Lab Results  Component Value Date   WBC 6.1 01/22/2024   HGB 17.4 (H) 01/22/2024   HCT 51.3 (H) 01/22/2024   MCV 92.3 01/22/2024   PLT 244 01/22/2024    Lab Results  Component Value Date   CREATININE 0.88 01/22/2024    Lab Results  Component Value Date   PSA 3.20 01/22/2024   PSA 5.27 (H) 11/23/2023   PSA 2.50 02/23/2023    No results found for: TESTOSTERONE  No results found for: HGBA1C  Urinalysis No results found for: COLORURINE, APPEARANCEUR, LABSPEC, PHURINE, GLUCOSEU, HGBUR, BILIRUBINUR, KETONESUR, PROTEINUR, UROBILINOGEN, NITRITE, LEUKOCYTESUR  No results found for: LABMICR, WBCUA, RBCUA, LABEPIT, MUCUS, BACTERIA  Pertinent Imaging:  No  results found for this or any previous visit.  No results found for this or any previous visit.  No results found for this or any previous visit.  No results found for this or any previous visit.  No results found for this or any previous visit.  No results found for this or any previous visit.  No results found for this or any previous visit.  No results found for this or any previous visit.   Assessment & Plan:    1. Elevated PSA (Primary) IsoPSA, will call with results. If elevated we will proceed with  prostate MRI. If normal, I will see him back in 6 months with PSA. - Urinalysis, Routine w reflex microscopic   No follow-ups on file.  Belvie Clara, MD  [facility]

## 2024-01-31 NOTE — Patient Instructions (Signed)

## 2024-02-05 ENCOUNTER — Ambulatory Visit: Admitting: Family Medicine

## 2024-02-05 VITALS — BP 128/90 | HR 81 | Temp 97.9°F | Ht 70.0 in | Wt 163.6 lb

## 2024-02-05 DIAGNOSIS — Z0001 Encounter for general adult medical examination with abnormal findings: Secondary | ICD-10-CM | POA: Diagnosis not present

## 2024-02-05 DIAGNOSIS — Z0000 Encounter for general adult medical examination without abnormal findings: Secondary | ICD-10-CM

## 2024-02-05 DIAGNOSIS — R972 Elevated prostate specific antigen [PSA]: Secondary | ICD-10-CM

## 2024-02-05 DIAGNOSIS — Z23 Encounter for immunization: Secondary | ICD-10-CM

## 2024-02-05 DIAGNOSIS — E7800 Pure hypercholesterolemia, unspecified: Secondary | ICD-10-CM

## 2024-02-05 MED ORDER — ESCITALOPRAM OXALATE 10 MG PO TABS: 10.0000 mg | ORAL_TABLET | Freq: Every day | ORAL | 5 refills | Status: DC

## 2024-02-05 NOTE — Progress Notes (Signed)
 Wt Readings from Last 3 Encounters:  02/05/24 163 lb 9.3 oz (74.2 kg)  02/27/23 177 lb 4 oz (80.4 kg)  01/24/23 173 lb 8 oz (78.7 kg)     Subjective:    Patient ID: Phillip Benson, male    DOB: [birth year], 63 y.o.   MRN: [id number]   Patient is a very pleasant 63 year old Caucasian gentleman here today for physical exam.  He has lost 10 pounds since his visit last year.  He attributes this to some stress.  His father died last year.  He is currently responsible for caring for his mother who has been in and out of several different rehab facilities and nursing homes due to falls.  He feels extremely stressed out by this.  He reports some depression.  He reports some anxiety.  He reports trouble sleeping and lack of energy.  He denies any suicidal ideation or homicidal ideation but he does feel like he is under a lot of stress in the past less desire to eat.  He states that he wishes he could just leave the area and [place] to the beach where [place] however he does not feel that he can do this because of his responsibility.  He is due for the pneumonia vaccine.  He is also due for the shingles vaccine but he declines this due to a personal history of shingles.  He is due for COVID and RSV.  Patient had Cologuard last year that was negative.  His PSA has been elevated however he is following up with urology every 6 months.  Otherwise he is doing well.  He does complain of some pain in his right shoulder has a positive empty can sign but has good strength and relatively good range of motion.  We discussed physical therapy for this versus an MRI however at the present time he declines a referral for physical therapy Immunization History  Administered Date(s) Administered   Influenza, Seasonal, Injecte, Preservative Fre 01/24/2023   Influenza,inj,Quad PF,6+ Mos 12/15/2014, 01/18/2022   Influenza-Unspecified 12/06/2014   Moderna SARS-COV2 Booster Vaccination 02/11/2020   Moderna Sars-Covid-2  Vaccination 07/04/2019, 08/01/2019   PNEUMOCOCCAL CONJUGATE-20 02/05/2024   Rabies, IM 03/12/2021, 03/15/2021, 03/19/2021, 03/26/2021   Tdap 04/30/2015, 10/29/2021    Past Medical History:  Diagnosis Date   Gastritis and duodenitis    egd-2017   Personal history of kidney stones 2008   x 1   Past Surgical History:  Procedure Laterality Date   colonscopy  may 2017   benign polyps removed   ESOPHAGOGASTRODUODENOSCOPY ENDOSCOPY  may 2017   KIDNEY STONE SURGERY  2008   KNEE ARTHROSCOPY WITH LATERAL MENISECTOMY Right 07/22/2015   Procedure: RIGHT KNEE ARTHROSCOPY WITH MEDIAL MENISCAL DEBRIDEMENT ;  Surgeon: Dempsey Moan, MD;  Location: [facility];  Service: Orthopedics;  Laterality: Right;   Current Outpatient Medications on File Prior to Visit  Medication Sig Dispense Refill   diphenoxylate -atropine  (LOMOTIL ) 2.5-0.025 MG tablet Take 2 tablets by mouth 4 (four) times daily as needed for diarrhea or loose stools. (Patient not taking: Reported on 01/24/2023) 30 tablet 0   ibuprofen  (ADVIL ) 800 MG tablet Take 1 tablet (800 mg total) by mouth 3 (three) times daily. 30 tablet 0   No current facility-administered medications on file prior to visit.     No Known Allergies Social History   Socioeconomic History   Marital status: Married    Spouse name: Not on file   Number of children: Not on file  Years of education: Not on file   Highest education level: 12th grade  Occupational History   Not on file  Tobacco Use   Smoking status: Never   Smokeless tobacco: Never  Substance and Sexual Activity   Alcohol use: No   Drug use: No   Sexual activity: Not on file  Other Topics Concern   Not on file  Social History Narrative   Not on file   Social Drivers of Health   Financial Resource Strain: Low Risk  (02/02/2024)   Overall Financial Resource Strain (CARDIA)    Difficulty of Paying Living Expenses: Not hard at all  Food Insecurity: No Food Insecurity (02/02/2024)   Hunger  Vital Sign    Worried About Running Out of Food in the Last Year: Never true    Ran Out of Food in the Last Year: Never true  Transportation Needs: No Transportation Needs (02/02/2024)   PRAPARE - Administrator, Civil Service (Medical): No    Lack of Transportation (Non-Medical): No  Physical Activity: Sufficiently Active (02/02/2024)   Exercise Vital Sign    Days of Exercise per Week: 7 days    Minutes of Exercise per Session: 30 min  Stress: Stress Concern Present (02/02/2024)   [employer] of Occupational Health - Occupational Stress Questionnaire    Feeling of Stress: To some extent  Social Connections: Socially Integrated (02/02/2024)   Social Connection and Isolation Panel    Frequency of Communication with Friends and Family: Three times a week    Frequency of Social Gatherings with Friends and Family: Once a week    Attends Religious Services: More than 4 times per year    Active Member of [employer] or Organizations: Yes    Attends Engineer, Structural: More than 4 times per year    Marital Status: Married  Catering Manager Violence: Unknown (06/11/2021)   Received from [facility]   HITS    Physically Hurt: Not on file    Insult or Talk Down To: Not on file    Threaten Physical Harm: Not on file    Scream or Curse: Not on file   Family History  Problem Relation Age of Onset   Kidney disease Mother    Heart disease Maternal Grandmother    Cancer Paternal Grandfather    Kidney disease Paternal Grandfather       Review of Systems  All other systems reviewed and are negative.      Objective:   Physical Exam Vitals reviewed.  Constitutional:      General: He is not in acute distress.    Appearance: Normal appearance. He is well-developed and normal weight. He is not ill-appearing, toxic-appearing or diaphoretic.  HENT:     Head: Normocephalic and atraumatic.     Right Ear: Tympanic membrane, ear canal and external ear normal.     Left  Ear: Tympanic membrane, ear canal and external ear normal.     Nose: Nose normal. No congestion or rhinorrhea.     Mouth/Throat:     Mouth: Mucous membranes are moist.     Pharynx: No oropharyngeal exudate.  Eyes:     General: No scleral icterus.       Right eye: No discharge.        Left eye: No discharge.     Conjunctiva/sclera: Conjunctivae normal.     Pupils: Pupils are equal, round, and reactive to light.  Neck:     Thyroid: No thyromegaly.  Vascular: No carotid bruit or JVD.     Trachea: No tracheal deviation.  Cardiovascular:     Rate and Rhythm: Normal rate and regular rhythm.     Heart sounds: Normal heart sounds. No murmur heard.    No friction rub. No gallop.  Pulmonary:     Effort: Pulmonary effort is normal. No respiratory distress.     Breath sounds: Normal breath sounds. No stridor. No wheezing or rales.  Chest:     Chest wall: No tenderness.  Abdominal:     General: Bowel sounds are normal. There is no distension.     Palpations: Abdomen is soft. There is no mass.     Tenderness: There is no abdominal tenderness. There is no guarding or rebound.  Musculoskeletal:     Cervical back: Normal range of motion and neck supple.     Right lower leg: No edema.     Left lower leg: No edema.  Lymphadenopathy:     Cervical: No cervical adenopathy.  Skin:    General: Skin is warm.     Coloration: Skin is not jaundiced or pale.     Findings: No bruising, erythema, lesion or rash.  Neurological:     General: No focal deficit present.     Mental Status: He is alert and oriented to person, place, and time. Mental status is at baseline.     Cranial Nerves: No cranial nerve deficit.     Sensory: No sensory deficit.     Motor: No weakness or abnormal muscle tone.     Coordination: Coordination normal.     Gait: Gait normal.     Deep Tendon Reflexes: Reflexes are normal and symmetric. Reflexes normal.  Psychiatric:        Mood and Affect: Mood normal.        Behavior:  Behavior normal.        Thought Content: Thought content normal.        Judgment: Judgment normal.    Office Visit on 01/31/2024  Component Date Value Ref Range Status   Specific Gravity, UA 01/31/2024 1.025  1.005 - 1.030 Final   pH, UA 01/31/2024 6.0  5.0 - 7.5 Final   Color, UA 01/31/2024 Yellow  Yellow Final   Appearance Ur 01/31/2024 Clear  Clear Final   Leukocytes,UA 01/31/2024 Negative  Negative Final   Protein,UA 01/31/2024 Negative  Negative/Trace Final   Glucose, UA 01/31/2024 Negative  Negative Final   Ketones, UA 01/31/2024 Negative  Negative Final   RBC, UA 01/31/2024 Trace (A)  Negative Final   Bilirubin, UA 01/31/2024 Negative  Negative Final   Urobilinogen, Ur 01/31/2024 1.0  0.2 - 1.0 mg/dL Final   Nitrite, UA 88/73/7974 Negative  Negative Final   Microscopic Examination 01/31/2024 See below:   Final   Microscopic was indicated and was performed.   WBC, UA 01/31/2024 None seen  0 - 5 /hpf Final   RBC, Urine 01/31/2024 0-2  0 - 2 /hpf Final   Epithelial Cells (non renal) 01/31/2024 0-10  0 - 10 /hpf Final   Bacteria, UA 01/31/2024 None seen  None seen/Few Final  Lab on 01/22/2024  Component Date Value Ref Range Status   WBC 01/22/2024 6.1  3.8 - 10.8 Thousand/uL Final   RBC 01/22/2024 5.56  4.20 - 5.80 Million/uL Final   Hemoglobin 01/22/2024 17.4 (H)  13.2 - 17.1 g/dL Final   HCT 88/82/7974 51.3 (H)  38.5 - 50.0 % Final   MCV 01/22/2024 92.3  80.0 - 100.0 fL Final   MCH 01/22/2024 31.3  27.0 - 33.0 pg Final   MCHC 01/22/2024 33.9  32.0 - 36.0 g/dL Final   Comment: For adults, a slight decrease in the calculated MCHC value (in the range of 30 to 32 g/dL) is most likely not clinically significant; however, it should be interpreted with caution in correlation with other red cell parameters and the patient's clinical condition.    RDW 01/22/2024 12.4  11.0 - 15.0 % Final   Platelets 01/22/2024 244  140 - 400 Thousand/uL Final   MPV 01/22/2024 10.3  7.5 - 12.5  fL Final   Neutro Abs 01/22/2024 3,819  1,500 - 7,800 cells/uL Final   Absolute Lymphocytes 01/22/2024 1,745  850 - 3,900 cells/uL Final   Absolute Monocytes 01/22/2024 421  200 - 950 cells/uL Final   Eosinophils Absolute 01/22/2024 98  15 - 500 cells/uL Final   Basophils Absolute 01/22/2024 18  0 - 200 cells/uL Final   Neutrophils Relative % 01/22/2024 62.6  % Final   Total Lymphocyte 01/22/2024 28.6  % Final   Monocytes Relative 01/22/2024 6.9  % Final   Eosinophils Relative 01/22/2024 1.6  % Final   Basophils Relative 01/22/2024 0.3  % Final   Glucose, Bld 01/22/2024 88  65 - 99 mg/dL Final   Comment: .            Fasting reference interval .    BUN 01/22/2024 14  7 - 25 mg/dL Final   Creat 88/82/7974 0.88  0.70 - 1.35 mg/dL Final   BUN/Creatinine Ratio 01/22/2024 SEE NOTE:  6 - 22 (calc) Final   Comment:    Not Reported: BUN and Creatinine are within    reference range. .    Sodium 01/22/2024 138  135 - 146 mmol/L Final   Potassium 01/22/2024 4.6  3.5 - 5.3 mmol/L Final   Chloride 01/22/2024 102  98 - 110 mmol/L Final   CO2 01/22/2024 27  20 - 32 mmol/L Final   Calcium 01/22/2024 9.4  8.6 - 10.3 mg/dL Final   Total Protein 88/82/7974 6.7  6.1 - 8.1 g/dL Final   Albumin 88/82/7974 4.3  3.6 - 5.1 g/dL Final   Globulin 88/82/7974 2.4  1.9 - 3.7 g/dL (calc) Final   AG Ratio 01/22/2024 1.8  1.0 - 2.5 (calc) Final   Total Bilirubin 01/22/2024 0.9  0.2 - 1.2 mg/dL Final   Alkaline phosphatase (APISO) 01/22/2024 57  35 - 144 U/L Final   AST 01/22/2024 20  10 - 35 U/L Final   ALT 01/22/2024 15  9 - 46 U/L Final   Cholesterol 01/22/2024 187  <200 mg/dL Final   HDL 88/82/7974 59  > OR = 40 mg/dL Final   Triglycerides 88/82/7974 120  <150 mg/dL Final   LDL Cholesterol (Calc) 01/22/2024 106 (H)  mg/dL (calc) Final   Comment: Reference range: <100 . Desirable range <100 mg/dL for primary prevention;   <70 mg/dL for patients with CHD or diabetic patients  with > or = 2 CHD risk  factors. [facility] LDL-C is now calculated using the Martin-Hopkins  calculation, which is a validated novel method providing  better accuracy than the Friedewald equation in the  estimation of LDL-C.  Gladis APPLETHWAITE et al. SANDREA. 7986;689(80): 2061-2068  (http://education.[facility].com/faq/FAQ164)    Total CHOL/HDL Ratio 01/22/2024 3.2  <4.9 (calc) Final   Non-HDL Cholesterol (Calc) 01/22/2024 128  <130 mg/dL (calc) Final   Comment: For patients with diabetes plus 1  major ASCVD risk  factor, treating to a non-HDL-C goal of <100 mg/dL  (LDL-C of <29 mg/dL) is considered a therapeutic  option.    PSA 01/22/2024 3.20  < OR = 4.00 ng/mL Final   Comment: The total PSA value from this assay system is  standardized against the WHO standard. The test  result will be approximately 20% lower when compared  to the equimolar-standardized total PSA (Beckman  Coulter). Comparison of serial PSA results should be  interpreted with this fact in mind. . This test was performed using the Siemens  chemiluminescent method. Values obtained from  different assay methods cannot be used interchangeably. PSA levels, regardless of value, should not be interpreted as absolute evidence of the presence or absence of disease.           Assessment & Plan:  Pure hypercholesterolemia - Plan: CT CARDIAC SCORING (SELF PAY ONLY)  Need for pneumococcal vaccine - Plan: Pneumococcal conjugate vaccine 20-valent (Prevnar 20)  General medical examination  Elevated PSA measurement Physical exam today is excellent.  Defer PSA and prostate cancer surveillance to his urologist.  Colon cancer screening is up-to-date.  Patient received Prevnar 20 today.  The remainder of his lab work is excellent outside of a mild elevation of his cholesterol.  We discussed a coronary artery calcium score and he would like to risk stratify himself further with this test.  We also discussed the stress that he is under and how some of his  symptoms qualify for depression.  He is interested in trying Lexapro 10 mg today for the next 6 weeks to see if this helps reduce some of the stress that he feels and helps him cope better with some of the sadness and guilt that he feels

## 2024-02-12 ENCOUNTER — Telehealth: Payer: Self-pay

## 2024-02-12 NOTE — Telephone Encounter (Signed)
 Return call to pt making him aware we have not received his PSA results and once resulted someone will reach out with results. Pt voiced understanding

## 2024-02-16 ENCOUNTER — Telehealth: Payer: Self-pay

## 2024-02-16 DIAGNOSIS — R972 Elevated prostate specific antigen [PSA]: Secondary | ICD-10-CM

## 2024-02-16 NOTE — Telephone Encounter (Signed)
 Patient called and made aware of [facility] and Dr. Little recommendations for a prostate MRI.

## 2024-02-26 ENCOUNTER — Telehealth: Payer: Self-pay | Admitting: Urology

## 2024-02-26 NOTE — Telephone Encounter (Signed)
 [facility]RA

## 2024-02-26 NOTE — Telephone Encounter (Signed)
 Patient called back. Pt is made aware MRI is pending and once MRI has been authorization then someone will call to scheduled MRI. Pt state's he called his insurance company and was told his MRI has been approved, pt is made aware the MRI order was placed 02/16/2024 and it's still pending on our end.  Pt is requesting MRI be done at an imaging center due to cost and co-payment. Pt is aware a message will be sent for his requested. Pt voiced understanding

## 2024-02-26 NOTE — Telephone Encounter (Signed)
 Tried returning call to patient about MRI. LVM making pt aware MRI has to be authorization through insurance and someone will reach out with appointment scheduled.

## 2024-02-27 ENCOUNTER — Telehealth: Payer: Self-pay

## 2024-02-27 NOTE — Telephone Encounter (Signed)
 Pt called because he has not been scheduled for his MRI pt states he has been waiting weeks and has heard nothing from our office pt states he would prefer to have images completed at [facility] if possible due to a lower copay. Pt advised that a message would be sent to imagine authorization department and if images are scheduled for [facility] he would need to bring in a physical disc pt voiced his understanding

## 2024-02-27 NOTE — Telephone Encounter (Signed)
 Ref # [id number]. Called pt's insurance company about MRI Authorization and scheduling . Spoke to [facility] and made her  Pt's wants to have his imaging done at [facility] and will need to bring in the physical disk for MD to review. Susie was made aware that we can not scheduled his MRI with [facility] and pt will need to call to scheduled his MRI appointment.

## 2024-02-28 ENCOUNTER — Other Ambulatory Visit: Payer: Self-pay | Admitting: Family Medicine

## 2024-02-28 ENCOUNTER — Ambulatory Visit (HOSPITAL_COMMUNITY)
Admission: RE | Admit: 2024-02-28 | Discharge: 2024-02-28 | Disposition: A | Discharge status: Home / Self Care | Payer: Self-pay | Source: Ambulatory Visit | Attending: Family Medicine | Admitting: Family Medicine

## 2024-02-28 DIAGNOSIS — E7800 Pure hypercholesterolemia, unspecified: Secondary | ICD-10-CM

## 2024-03-01 ENCOUNTER — Ambulatory Visit: Payer: Self-pay | Admitting: Family Medicine

## 2024-03-05 ENCOUNTER — Ambulatory Visit

## 2024-03-05 ENCOUNTER — Ambulatory Visit: Payer: Self-pay | Admitting: Family Medicine

## 2024-03-05 VITALS — BP 115/76 | HR 74 | Temp 98.1°F | Ht 70.0 in | Wt 170.0 lb

## 2024-03-05 DIAGNOSIS — J069 Acute upper respiratory infection, unspecified: Secondary | ICD-10-CM | POA: Diagnosis not present

## 2024-03-05 MED ORDER — BENZONATATE 100 MG PO CAPS: 100.0000 mg | ORAL_CAPSULE | Freq: Two times a day (BID) | ORAL | 0 refills | Status: AC | PRN

## 2024-03-05 NOTE — Progress Notes (Addendum)
 "  Acute Office Visit  Subjective:     Patient ID: Phillip Benson, male    DOB: Mar 04, 1961, 63 y.o.   MRN: [id number]  Chief Complaint  Patient presents with   Sore Throat    Started night of Dec.26th    Cough    Coughing up yellow/beige phlegm  PT stated that his chest felt tight- hard to breathe especially in the mornings  PT stated that normally his sicknesses will turn into bronchitis - No fever, at home covid test negative - CT scan Dec. 24th showed lunch scarring     HPI Patient is in today for productive cough and fatigue that started on Friday. Denies fever, body aches, chills, shortness of breath or wheezing. States that chest feels tight in the mornings, and will have coughing spells. Makes it hard to catch his breath. Cough is productive with yellow to tan drainage. Denies any sick contacts. Says that when he gets sick in the winter it turns into bronchitis normally. Has tried Mucinex and Robitussin for symptom relief.   Review of Systems  Constitutional:  Positive for fatigue. Negative for chills and fever.  HENT:  Negative for congestion, ear discharge, ear pain, postnasal drip, rhinorrhea, sinus pressure, sinus pain and sore throat.   Respiratory:  Positive for cough and chest tightness. Negative for shortness of breath and wheezing.   Cardiovascular:  Negative for chest pain and palpitations.  Neurological:  Negative for headaches.       Objective:    Today's Vitals   03/05/24 1347  BP: 115/76  Pulse: 74  Temp: 98.1 F (36.7 C)  SpO2: 97%  Weight: 170 lb (77.1 kg)  Height: 5' 10 (1.778 m)   Body mass index is 24.39 kg/m.   Physical Exam Vitals and nursing note reviewed.  Constitutional:      General: He is not in acute distress.    Appearance: Normal appearance. He is not ill-appearing.  HENT:     Right Ear: Ear canal and external ear normal. Tympanic membrane is retracted.     Left Ear: Ear canal and external ear normal. Tympanic membrane is  retracted.     Mouth/Throat:     Pharynx: No oropharyngeal exudate or posterior oropharyngeal erythema.     Tonsils: No tonsillar exudate.  Cardiovascular:     Rate and Rhythm: Normal rate and regular rhythm.     Heart sounds: Normal heart sounds, S1 normal and S2 normal. No murmur heard. Pulmonary:     Effort: Pulmonary effort is normal. No respiratory distress.     Breath sounds: Normal breath sounds. No wheezing.  Lymphadenopathy:     Cervical: No cervical adenopathy.  Neurological:     Mental Status: He is alert.  Psychiatric:        Mood and Affect: Mood normal.        Behavior: Behavior normal.        Thought Content: Thought content normal.        Judgment: Judgment normal.    No results found for any visits on 03/05/24.    Assessment & Plan:  1. Viral upper respiratory tract infection (Primary) -Discussed with patient that due to duration of symptoms being 4 days, that this is most likely a viral URI. Is self-limiting and does not warrant antibiotics at this time.  -Patient has an albuterol inhaler at home and advised to take as needed for SOB or spasmodic cough. Advised that this is an as needed medication, and if  he is taking it frequently to let us  know.  -Advised patient that if he experiences double worsening, fever, body aches or chills, to notify the office. Patient agrees.   -Supportive care discussed (hydration, nasal saline, analgesics as appropriate). -Patient advised to complete full antibiotic course and return if symptoms worsen or fail to improve. -ER precautions reviewed.   - benzonatate (TESSALON) 100 MG capsule; Take 1 capsule (100 mg total) by mouth 2 (two) times daily as needed for cough.  Dispense: 20 capsule; Refill: 0   Damien KATHEE Pringle, FNP  "

## 2024-03-05 NOTE — Telephone Encounter (Signed)
 Provider prior authorization request form faxed to [facility].

## 2024-03-05 NOTE — Telephone Encounter (Signed)
 Spoke with [name] who is aware that Mri has been scheduled for 03/12/2024.

## 2024-03-05 NOTE — Patient Instructions (Signed)
"-   Delsym  "

## 2024-03-06 ENCOUNTER — Ambulatory Visit (HOSPITAL_COMMUNITY)

## 2024-03-07 ENCOUNTER — Encounter

## 2024-03-11 ENCOUNTER — Ambulatory Visit: Admitting: Family Medicine

## 2024-03-11 ENCOUNTER — Ambulatory Visit: Payer: Self-pay | Admitting: *Deleted

## 2024-03-11 ENCOUNTER — Encounter: Payer: Self-pay | Admitting: Family Medicine

## 2024-03-11 ENCOUNTER — Ambulatory Visit
Admission: RE | Admit: 2024-03-11 | Discharge: 2024-03-11 | Disposition: A | Discharge status: Home / Self Care | Source: Ambulatory Visit | Attending: Family Medicine | Admitting: Family Medicine

## 2024-03-11 VITALS — BP 123/82 | HR 90 | Temp 98.1°F | Ht 70.0 in | Wt 169.8 lb

## 2024-03-11 DIAGNOSIS — J069 Acute upper respiratory infection, unspecified: Secondary | ICD-10-CM | POA: Diagnosis not present

## 2024-03-11 DIAGNOSIS — R0602 Shortness of breath: Secondary | ICD-10-CM

## 2024-03-11 MED ORDER — AZITHROMYCIN 250 MG PO TABS: ORAL_TABLET | ORAL | 0 refills | Status: AC

## 2024-03-11 MED ORDER — AMOXICILLIN-POT CLAVULANATE 875-125 MG PO TABS: 1.0000 | ORAL_TABLET | Freq: Two times a day (BID) | ORAL | 0 refills | Status: AC

## 2024-03-11 MED ORDER — ALBUTEROL SULFATE HFA 108 (90 BASE) MCG/ACT IN AERS: 2.0000 | INHALATION_SPRAY | Freq: Four times a day (QID) | RESPIRATORY_TRACT | 0 refills | Status: AC | PRN

## 2024-03-11 NOTE — Telephone Encounter (Signed)
 FYI Only or Action Required?: FYI only for provider: appointment scheduled on 1/5.  Patient was last seen in primary care on 03/05/2024 by Gerard Damien NOVAK, FNP.  Called Nurse Triage reporting Shortness of Breath.  Symptoms began several weeks ago.  Interventions attempted: Prescription medications: benzonatate  .  Symptoms are: gradually worsening.  Triage Disposition: See HCP Within 4 Hours (Or PCP Triage)  Patient/caregiver understands and will follow disposition?: Yes  Copied from CRM #[id number]. Topic: Clinical - Red Word Triage >> Mar 11, 2024  8:06 AM Emylou G wrote: [facility] that prompted transfer to Nurse Triage: deep cough ( can't catch his breath ) bad headache.. was seen 12/30 - now sickness is in his chest ( worsening ) Reason for Disposition  [1] MILD difficulty breathing (e.g., minimal/no SOB at rest, SOB with walking, pulse < 100) AND [2] NEW-onset or WORSE than normal  Answer Assessment - Initial Assessment Questions Patient was seen in office last week- worsening symptoms-cough has moved to the chest with cough that can cause SOB.   1. RESPIRATORY STATUS: Describe your breathing? (e.g., wheezing, shortness of breath, unable to speak, severe coughing)      SOB- with cough 2. ONSET: When did this breathing problem begin?      12/26 3. PATTERN Does the difficult breathing come and go, or has it been constant since it started?      Comes and goes- with cough 4. SEVERITY: How bad is your breathing? (e.g., mild, moderate, severe)      mild 5. RECURRENT SYMPTOM: Have you had difficulty breathing before? If Yes, ask: When was the last time? and What happened that time?      Yes- it has been a while 6. CARDIAC HISTORY: Do you have any history of heart disease? (e.g., heart attack, angina, bypass surgery, angioplasty)      no 7. LUNG HISTORY: Do you have any history of lung disease?  (e.g., pulmonary embolus, asthma, emphysema)     no 8. CAUSE: What do  you think is causing the breathing problem?      Sinus congestion 9. OTHER SYMPTOMS: Do you have any other symptoms? (e.g., chest pain, cough, dizziness, fever, runny nose)     Cough, fatigue, headache 10. O2 SATURATION MONITOR:  Do you use an oxygen saturation monitor (pulse oximeter) at home? If Yes, ask: What is your reading (oxygen level) today? What is your usual oxygen saturation reading? (e.g., 95%)       na  Protocols used: Breathing Difficulty-A-AH

## 2024-03-11 NOTE — Progress Notes (Signed)
 "  Acute Office Visit  Patient ID: Phillip Benson, male    DOB: [birth year], 64 y.o.   MRN: [id number]  PCP: Duanne Butler DASEN, MD  Chief Complaint  Patient presents with   Acute Visit    Cough, chest congestion feels he has bronchitis .  Symptoms of fever, cough, chest congestion, pain in his left ribs due to cough sinus drainage, left ear pain yellow mucus x 1 week      Subjective:     HPI  Discussed the use of AI scribe software for clinical note transcription with the patient, who gave verbal consent to proceed.  History of Present Illness Phillip Benson is a 64 year old male who presents with sinus pain, pressure, and productive cough.  He has been experiencing sinus pain and pressure, along with a productive cough producing yellow mucus. The mucus is difficult to expel, and he has been using a neti pot and gargling with salt water  and iodine  for relief. He takes Mucinex and Nyquil at night to aid sleep, though Tessalon  Perles has been ineffective. An old cough medicine with codeine helps him sleep after about thirty minutes.  He experienced a fever of 100.17F on Thursday, New Year's Day, and has had an episode of passing out while brushing his teeth due to extreme tiredness. A COVID test last week was negative. He hears crackling in his lungs when breathing and has a cough that sometimes causes him to pull a muscle in his ribs. He notes shortness of breath after coughing spells.  He has a history of fluid pneumonia in November and received a pneumonia shot about a month ago. He has not used antibiotics recently and has been holding off on taking amoxicillin  until this visit. He uses hot showers and a humidifier to help with symptoms, which sometimes lead to prolonged coughing fits.  He mentions a PSA blood test result of 7.9, expressing concern about prostate cancer. He does not smoke.   Review of Systems  All other systems reviewed and are negative.   Past Medical History:   Diagnosis Date   Gastritis and duodenitis    egd-2017   Personal history of kidney stones 2008   x 1    Past Surgical History:  Procedure Laterality Date   colonscopy  may 2017   benign polyps removed   ESOPHAGOGASTRODUODENOSCOPY ENDOSCOPY  may 2017   KIDNEY STONE SURGERY  2008   KNEE ARTHROSCOPY WITH LATERAL MENISECTOMY Right 07/22/2015   Procedure: RIGHT KNEE ARTHROSCOPY WITH MEDIAL MENISCAL DEBRIDEMENT ;  Surgeon: Dempsey Moan, MD;  Location: [facility];  Service: Orthopedics;  Laterality: Right;    Outpatient Medications Prior to Visit  Medication Sig Dispense Refill   ascorbic acid (VITAMIN C) 500 MG tablet Take 500 mg by mouth daily.     benzonatate  (TESSALON ) 100 MG capsule Take 1 capsule (100 mg total) by mouth 2 (two) times daily as needed for cough. 20 capsule 0   cholecalciferol (VITAMIN D3) 25 MCG (1000 UNIT) tablet Take 1,000 Units by mouth daily.     zinc gluconate 50 MG tablet Take 50 mg by mouth daily.     No facility-administered medications prior to visit.    Allergies[1]     Objective:    BP 123/82   Pulse 90   Temp 98.1 F (36.7 C)   Ht 5' 10 (1.778 m)   Wt 169 lb 12.8 oz (77 kg)   SpO2 97%   BMI 24.36 kg/m  Physical Exam Vitals and nursing note reviewed.  Constitutional:      Appearance: Normal appearance. He is normal weight.  HENT:     Head: Normocephalic and atraumatic.  Cardiovascular:     Rate and Rhythm: Normal rate and regular rhythm.     Pulses: Normal pulses.     Heart sounds: Normal heart sounds.  Pulmonary:     Effort: Pulmonary effort is normal.     Breath sounds: Rales present.     Comments: Coarse cough Skin:    General: Skin is warm and dry.     Capillary Refill: Capillary refill takes less than 2 seconds.  Neurological:     General: No focal deficit present.     Mental Status: He is alert and oriented to person, place, and time. Mental status is at baseline.  Psychiatric:        Mood and Affect: Mood normal.         Behavior: Behavior normal.        Thought Content: Thought content normal.        Judgment: Judgment normal.       No results found for any visits on 03/11/24.     Assessment & Plan:   Problem List Items Addressed This Visit       Respiratory   Upper respiratory tract infection - Primary   Relevant Medications   azithromycin  (ZITHROMAX ) 250 MG tablet   Other Visit Diagnoses       Shortness of breath       Relevant Orders   DG Chest 2 View       Assessment and Plan Assessment & Plan Acute sinusitis and possible pneumonia Sinus pain, pressure, and yellow mucus suggest acute sinusitis. Crackling lung sounds indicate possible pneumonia. Suspicion for sinus infection or pneumonia. - Ordered chest x-ray to evaluate for pneumonia. - Prescribed Augmentin  and doxycycline for bacterial infection. - Advised Mucinex and increased fluid intake for mucus expectoration. - Prescribed albuterol  inhaler for shortness of breath or wheezing. - Follow up as needed if symptoms persist or worsen.    Meds ordered this encounter  Medications   amoxicillin -clavulanate (AUGMENTIN ) 875-125 MG tablet    Sig: Take 1 tablet by mouth 2 (two) times daily.    Dispense:  20 tablet    Refill:  0    Supervising Provider:   DUANNE LOWERS T [[id number]]   azithromycin  (ZITHROMAX ) 250 MG tablet    Sig: Take 2 tablets on day 1, then 1 tablet daily on days 2 through 5    Dispense:  6 tablet    Refill:  0    Supervising Provider:   DUANNE LOWERS T [[id number]]   albuterol  (VENTOLIN  HFA) 108 (90 Base) MCG/ACT inhaler    Sig: Inhale 2 puffs into the lungs every 6 (six) hours as needed for wheezing or shortness of breath.    Dispense:  8 g    Refill:  0    Supervising Provider:   DUANNE LOWERS T [[id number]]    No follow-ups on file.  Phillip GORMAN Barrio, FNP [facility]      [1] No Known Allergies  "

## 2024-03-12 ENCOUNTER — Ambulatory Visit (HOSPITAL_COMMUNITY)
Admission: RE | Admit: 2024-03-12 | Discharge: 2024-03-12 | Disposition: A | Discharge status: Home / Self Care | Source: Ambulatory Visit | Attending: Urology | Admitting: Urology

## 2024-03-12 ENCOUNTER — Ambulatory Visit: Payer: Self-pay | Admitting: Family Medicine

## 2024-03-12 DIAGNOSIS — R972 Elevated prostate specific antigen [PSA]: Secondary | ICD-10-CM | POA: Insufficient documentation

## 2024-03-12 MED ORDER — GADOBUTROL 1 MMOL/ML IV SOLN
8.0000 mL | Freq: Once | INTRAVENOUS | Status: AC | PRN
  Administered 2024-03-12: 8 mL via INTRAVENOUS

## 2024-03-20 ENCOUNTER — Telehealth: Payer: Self-pay

## 2024-03-20 NOTE — Telephone Encounter (Signed)
 Called pt back to make him aware MRI has been routed to Dr. Sherrilee to review and once review someone will call him back with results. Voiced understanding.

## 2024-03-22 ENCOUNTER — Telehealth: Payer: Self-pay | Admitting: Family Medicine

## 2024-03-22 NOTE — Telephone Encounter (Signed)
 Patient called requesting MRI results. Pt state's he is trying to plan a trip and would really like results.

## 2024-03-22 NOTE — Telephone Encounter (Signed)
 Erroneous encounter. Please disregard.

## 2024-03-27 ENCOUNTER — Telehealth: Payer: Self-pay

## 2024-03-27 DIAGNOSIS — R972 Elevated prostate specific antigen [PSA]: Secondary | ICD-10-CM

## 2024-03-27 NOTE — Telephone Encounter (Signed)
 Patient is requesting a call back from Alan Ruth, [employer]. He had a MRI done with no call back to discuss results. Patient is concerned with finding of growth spot and next steps needed.  Please advise.  Call:  [phone]

## 2024-03-27 NOTE — Telephone Encounter (Signed)
-----   Message from Belvie Clara, MD sent at 03/26/2024  8:21 AM EST ----- MRi shows 1 small lesion concerning for prostate cancer. Please refer to [facility] for MRI fusion biopsy ----- Message ----- From: Gretta Carlos SAUNDERS, CMA Sent: 03/20/2024   2:27 PM EST To: Belvie LITTIE Clara, MD  Please review. Pt want results

## 2024-03-27 NOTE — Telephone Encounter (Signed)
 Tried calling pt with no answer. LVM for return call

## 2024-03-27 NOTE — Telephone Encounter (Signed)
-----   Message from Belvie Clara, MD sent at 03/26/2024  8:21 AM EST ----- MRi shows 1 small lesion concerning for prostate cancer. Please refer to [facility] for MRI fusion biopsy ----- Message ----- From: Gretta Carlos SAUNDERS, CMA Sent: 03/20/2024   2:27 PM EST To: Belvie LITTIE Clara, MD  Please review. Pt want results

## 2024-03-27 NOTE — Telephone Encounter (Signed)
 I called and spoke with patient concerning his MRI results and next step.  Results given-patient voiced understanding of the results and next steps.  Patient will be going out of town for appox 1 month in February. Notes to referring office updated in referral to reflect patients scheduling limitations.

## 2024-03-28 ENCOUNTER — Telehealth: Payer: Self-pay | Admitting: Urology

## 2024-03-28 NOTE — Telephone Encounter (Signed)
 Pt is scheduled for fusion biopsy on 3/5 with Dr. Francisca (per referral from Dr. Sherrilee)  Can you please send instructions via MyChart?

## 2024-03-28 NOTE — Telephone Encounter (Signed)
 Instructions sent

## 2024-04-05 ENCOUNTER — Telehealth: Payer: Self-pay

## 2024-04-05 NOTE — Telephone Encounter (Signed)
-----   Message from Belvie Clara, MD sent at 03/26/2024  8:21 AM EST ----- MRi shows 1 small lesion concerning for prostate cancer. Please refer to [facility] for MRI fusion biopsy ----- Message ----- From: Gretta Carlos SAUNDERS, CMA Sent: 03/20/2024   2:27 PM EST To: Belvie LITTIE Clara, MD  Please review. Pt want results

## 2024-04-05 NOTE — Telephone Encounter (Signed)
 See other encounters

## 2024-05-09 ENCOUNTER — Other Ambulatory Visit: Admitting: Urology

## 2024-07-30 ENCOUNTER — Other Ambulatory Visit

## 2024-08-12 ENCOUNTER — Ambulatory Visit: Admitting: Urology
